# Patient Record
Sex: Female | Born: 1960 | Race: White | Hispanic: Refuse to answer | Marital: Married | Smoking: Never smoker
Health system: Southern US, Community
[De-identification: ages and names within clinical notes are randomized; demographics above are authoritative.]

## PROBLEM LIST (undated history)

## (undated) DIAGNOSIS — I10 Essential (primary) hypertension: Secondary | ICD-10-CM

## (undated) HISTORY — PX: VAGINAL HYSTERECTOMY: SUR661

---

## 2018-01-18 ENCOUNTER — Observation Stay (HOSPITAL_COMMUNITY)
Admission: AD | Admit: 2018-01-18 | Discharge: 2018-01-19 | Disposition: A | Discharge status: Home / Self Care | Payer: BC Managed Care – PPO | Source: Other Acute Inpatient Hospital | Attending: Internal Medicine | Admitting: Internal Medicine

## 2018-01-18 ENCOUNTER — Encounter (HOSPITAL_COMMUNITY): Payer: Self-pay | Admitting: Internal Medicine

## 2018-01-18 DIAGNOSIS — W1809XA Striking against other object with subsequent fall, initial encounter: Secondary | ICD-10-CM | POA: Diagnosis not present

## 2018-01-18 DIAGNOSIS — S82401A Unspecified fracture of shaft of right fibula, initial encounter for closed fracture: Secondary | ICD-10-CM | POA: Diagnosis present

## 2018-01-18 DIAGNOSIS — Z8249 Family history of ischemic heart disease and other diseases of the circulatory system: Secondary | ICD-10-CM | POA: Diagnosis not present

## 2018-01-18 DIAGNOSIS — I700 Atherosclerosis of aorta: Secondary | ICD-10-CM | POA: Insufficient documentation

## 2018-01-18 DIAGNOSIS — I10 Essential (primary) hypertension: Secondary | ICD-10-CM | POA: Diagnosis present

## 2018-01-18 DIAGNOSIS — S82841A Displaced bimalleolar fracture of right lower leg, initial encounter for closed fracture: Secondary | ICD-10-CM | POA: Insufficient documentation

## 2018-01-18 DIAGNOSIS — M19019 Primary osteoarthritis, unspecified shoulder: Secondary | ICD-10-CM | POA: Insufficient documentation

## 2018-01-18 DIAGNOSIS — Z880 Allergy status to penicillin: Secondary | ICD-10-CM | POA: Insufficient documentation

## 2018-01-18 DIAGNOSIS — Z79899 Other long term (current) drug therapy: Secondary | ICD-10-CM | POA: Diagnosis not present

## 2018-01-18 DIAGNOSIS — R9431 Abnormal electrocardiogram [ECG] [EKG]: Secondary | ICD-10-CM

## 2018-01-18 DIAGNOSIS — I214 Non-ST elevation (NSTEMI) myocardial infarction: Secondary | ICD-10-CM | POA: Insufficient documentation

## 2018-01-18 HISTORY — DX: Essential (primary) hypertension: I10

## 2018-01-18 HISTORY — DX: Unspecified fracture of shaft of right fibula, initial encounter for closed fracture: S82.401A

## 2018-01-18 HISTORY — DX: Abnormal electrocardiogram (ECG) (EKG): R94.31

## 2018-01-18 LAB — COMPREHENSIVE METABOLIC PANEL
ALK PHOS: 42 U/L (ref 38–126)
ALT: 18 U/L (ref 0–44)
AST: 14 U/L — AB (ref 15–41)
Albumin: 3.4 g/dL — ABNORMAL LOW (ref 3.5–5.0)
Anion gap: 10 (ref 5–15)
BILIRUBIN TOTAL: 0.9 mg/dL (ref 0.3–1.2)
BUN: 9 mg/dL (ref 6–20)
CALCIUM: 8.8 mg/dL — AB (ref 8.9–10.3)
CO2: 22 mmol/L (ref 22–32)
CREATININE: 0.54 mg/dL (ref 0.44–1.00)
Chloride: 107 mmol/L (ref 98–111)
GFR calc Af Amer: >60 mL/min (ref >60)
GLUCOSE: 104 mg/dL — AB (ref 70–99)
POTASSIUM: 4.2 mmol/L (ref 3.5–5.1)
Sodium: 139 mmol/L (ref 135–145)
TOTAL PROTEIN: 5.9 g/dL — AB (ref 6.5–8.1)

## 2018-01-18 LAB — CBC
HEMATOCRIT: 39.1 % (ref 36.0–46.0)
HEMOGLOBIN: 12.9 g/dL (ref 12.0–15.0)
MCH: 29.8 pg (ref 26.0–34.0)
MCHC: 33 g/dL (ref 30.0–36.0)
MCV: 90.3 fL (ref 78.0–100.0)
Platelets: 297 10*3/uL (ref 150–400)
RBC: 4.33 MIL/uL (ref 3.87–5.11)
RDW: 12.2 % (ref 11.5–15.5)
WBC: 11.6 10*3/uL — AB (ref 4.0–10.5)

## 2018-01-18 LAB — TROPONIN I: Troponin I: <0.03 ng/mL (ref <0.03)

## 2018-01-18 LAB — MRSA PCR SCREENING: MRSA by PCR: NEGATIVE

## 2018-01-18 MED ORDER — SODIUM CHLORIDE 0.9 % IV SOLN: 250.0000 mL | INTRAVENOUS | Status: DC | PRN

## 2018-01-18 MED ORDER — SODIUM CHLORIDE 0.9% FLUSH
3.0000 mL | Freq: Two times a day (BID) | INTRAVENOUS | Status: DC
  Administered 2018-01-18 – 2018-01-19 (×2): 3 mL via INTRAVENOUS

## 2018-01-18 MED ORDER — ACETAMINOPHEN 650 MG RE SUPP: 650.0000 mg | Freq: Four times a day (QID) | RECTAL | Status: DC | PRN

## 2018-01-18 MED ORDER — SODIUM CHLORIDE 0.9% FLUSH: 3.0000 mL | INTRAVENOUS | Status: DC | PRN

## 2018-01-18 MED ORDER — CLONAZEPAM 0.5 MG PO TABS: 0.5000 mg | ORAL_TABLET | Freq: Every evening | ORAL | Status: DC | PRN

## 2018-01-18 MED ORDER — ENOXAPARIN SODIUM 40 MG/0.4ML ~~LOC~~ SOLN
40.0000 mg | SUBCUTANEOUS | Status: DC
  Administered 2018-01-19: 40 mg via SUBCUTANEOUS
  Filled 2018-01-18: qty 0.4

## 2018-01-18 MED ORDER — ONDANSETRON HCL 4 MG/2ML IJ SOLN: 4.0000 mg | Freq: Four times a day (QID) | INTRAMUSCULAR | Status: DC | PRN

## 2018-01-18 MED ORDER — ATORVASTATIN CALCIUM 80 MG PO TABS: 80.0000 mg | ORAL_TABLET | Freq: Every day | ORAL | Status: DC

## 2018-01-18 MED ORDER — ACETAMINOPHEN 325 MG PO TABS: 650.0000 mg | ORAL_TABLET | Freq: Four times a day (QID) | ORAL | Status: DC | PRN

## 2018-01-18 MED ORDER — CARVEDILOL 3.125 MG PO TABS
3.1250 mg | ORAL_TABLET | Freq: Two times a day (BID) | ORAL | Status: DC
  Administered 2018-01-19: 3.125 mg via ORAL
  Filled 2018-01-18: qty 1

## 2018-01-18 MED ORDER — MORPHINE SULFATE (PF) 2 MG/ML IV SOLN: 2.0000 mg | INTRAVENOUS | Status: DC | PRN

## 2018-01-18 MED ORDER — ASPIRIN EC 325 MG PO TBEC
325.0000 mg | DELAYED_RELEASE_TABLET | Freq: Every day | ORAL | Status: DC
  Administered 2018-01-18 – 2018-01-19 (×2): 325 mg via ORAL
  Filled 2018-01-18 (×2): qty 1

## 2018-01-18 MED ORDER — OXYCODONE HCL 5 MG PO TABS
5.0000 mg | ORAL_TABLET | ORAL | Status: DC | PRN
  Administered 2018-01-18 – 2018-01-19 (×5): 5 mg via ORAL
  Filled 2018-01-18 (×5): qty 1

## 2018-01-18 NOTE — H&P (Signed)
TRH H&P   Patient Demographics:    Frances Bell, is a 57 y.o. female  MRN: [id number]   DOB - Apr 24, 1961  Admit Date - 01/18/2018  Outpatient Primary MD for the patient is No primary care provider on file.  Referring MD/NP/PA: Santiago Bumpersavid Borowski  Outpatient Specialists:   Patient coming [facility]: [facility]  No chief complaint on file.  Abnormal EKG   HPI:    Frances Bell  is a 57 y.o. female, w hypertension, and apparently presented to [facility] on 01/15/2018 with right ankle pain.  Apparently she tripped on a toy on 01/12/2018 and had pain and swelling.  Pt was seen in ER  CT scan 01/12/2018=>  Impression 1. Medial and posterior malleolar fractures of the right ankle with distal fibular diaphyseal fracture and also demonstrated in near anatomic alignment post reduction.  Reduction of distlocated ankle joint.  2. Moderate degree of medial soft tissue swelling is noted about the ankle status post trauma.    In ER a closed reduction and splinting performed.  Pt was apparently taking to OR 01/18/2018 for ORIF of R distal fibrular and medial malleolus with closed reduction and internal fixation of syndesmosis disruption.    Post surgery in [place],  ST depression was noted on monitor.  Pt had 12 lead ekg that showed up to 3mm of deep ST depression v3-6 and deep symmetric T wave inversions.  Cardiology Dr. Junious DresserBoroski was consulted and recommended [facility].    I could not find troponin done at [facility]    Review of systems:    In addition to the HPI above, No Fever-chills, No Headache, No changes with Vision or hearing, No problems swallowing food or Liquids, No Chest pain, Cough or Shortness of Breath, No Abdominal pain, No Nausea or Vommitting, Bowel movements are regular, No Blood in stool or Urine, No dysuria, No  new skin rashes or bruises, ,  No new weakness, tingling, numbness in any extremity, No recent weight gain or loss, No polyuria, polydypsia or polyphagia, No significant Mental Stressors.  A full 10 point Review of Systems was done, except as stated above, all other Review of Systems were negative.   With Past History of the following :    Past Medical History:  Diagnosis Date  . Hypertension       Past Surgical History:  Procedure Laterality Date  . VAGINAL HYSTERECTOMY        Social History:     Social History   Tobacco Use  . Smoking status: Never Smoker  . Smokeless tobacco: Never Used  Substance Use Topics  . Alcohol use: Not Currently     Lives - at home Mobility - typically walks by self   Family History :     Family History  Problem Relation Age of Onset  . Lung  cancer Mother   . CAD Mother        ? broken heart at age 41  . Lymphoma Sister   . Prostate cancer Brother       Home Medications:   Prior to Admission medications   Not on File     Allergies:     Allergies  Allergen Reactions  . Amoxicillin Hives     Physical Exam:   Vitals  Prior to transfer  T 98.4 P 71  R 18, pox 100% on RA  Bp 133/78 (1539)  Blood pressure 130/70, pulse 69, temperature 98.3 F (36.8 C), temperature source Oral, resp. rate 15.   1. General  lying in bed in NAD,    2. Normal affect and insight, Not Suicidal or Homicidal, Awake Alert, Oriented X 3.  3. No F.N deficits, ALL C.Nerves Intact, Strength 5/5 all 4 extremities, Sensation intact all 4 extremities, Plantars down going.  4. Ears and Eyes appear Normal, Conjunctivae clear, PERRLA. Moist Oral Mucosa.  5. Supple Neck, No JVD, No cervical lymphadenopathy appriciated, No Carotid Bruits.  6. Symmetrical Chest wall movement, Good air movement bilaterally, CTAB.  7. RRR, No Gallops, Rubs or Murmurs, No Parasternal Heave.  8. Positive Bowel Sounds, Abdomen Soft, No tenderness, No organomegaly  appriciated,No rebound -guarding or rigidity.  9.  No Cyanosis, Normal Skin Turgor, No Skin Rash or Bruise.  10. Good muscle tone,  joints appear normal , no effusions, Normal ROM.  11. No Palpable Lymph Nodes in Neck or Axillae     Data Review:    CBC No results for input(s): WBC, HGB, HCT, PLT, MCV, MCH, MCHC, RDW, LYMPHSABS, MONOABS, EOSABS, BASOSABS, BANDABS in the last 168 hours.  Invalid input(s): NEUTRABS, BANDSABD ------------------------------------------------------------------------------------------------------------------  Chemistries  No results for input(s): NA, K, CL, CO2, GLUCOSE, BUN, CREATININE, CALCIUM, MG, AST, ALT, ALKPHOS, BILITOT in the last 168 hours.  Invalid input(s): GFRCGP ------------------------------------------------------------------------------------------------------------------ CrCl cannot be calculated (No successful lab value found.). ------------------------------------------------------------------------------------------------------------------ No results for input(s): TSH, T4TOTAL, T3FREE, THYROIDAB in the last 72 hours.  Invalid input(s): FREET3  Coagulation profile No results for input(s): INR, PROTIME in the last 168 hours. ------------------------------------------------------------------------------------------------------------------- No results for input(s): DDIMER in the last 72 hours. -------------------------------------------------------------------------------------------------------------------  Cardiac Enzymes No results for input(s): CKMB, TROPONINI, MYOGLOBIN in the last 168 hours.  Invalid input(s): CK ------------------------------------------------------------------------------------------------------------------ No results found for: BNP   ---------------------------------------------------------------------------------------------------------------  Urinalysis No results found for: COLORURINE, APPEARANCEUR,  LABSPEC, PHURINE, GLUCOSEU, HGBUR, BILIRUBINUR, KETONESUR, PROTEINUR, UROBILINOGEN, NITRITE, LEUKOCYTESUR  ----------------------------------------------------------------------------------------------------------------   Imaging Results:    No results found.  ekg reviewed nsr at 90, nl axis, nl pr int, slight prolongation of qtc, st depression v3-6, 2, 3, avf, and t inversion in 1, avl and v2-6   Assessment & Plan:    Principal Problem:   Abnormal EKG Active Problems:   Hypertension   Closed right fibular fracture    Abnormal EKG, concern for NSTEMI per cardiology Trop I q6h x3 Check lipid Check cardiac echo Aspirin Start Lipitor 80mg  po qhs Start Carvedilol 3.125mg  po bid No heparin iv due to recent surgery per Dr. Nino Parsley, defer to cardiology here NPO after [facility] consult requested, personally called cardiology to request input, appreciate guidance   R distal fibular fracture Orthopedics apparently consulted ? email from [facility] states that they are ok with following ? Morphine sulfate 0.5mg  iv q3h prn  Hypertension Start carvedilol as above     DVT Prophylaxis  Lovenox - SCDs  AM  Labs Ordered, also please review Full Orders  Family Communication: Admission, patients condition and plan of care including tests being ordered have been discussed with the patient  who indicate understanding and agree with the plan and Code Status.  Code Status FULL CODE  Likely DC to  home  Condition GUARDED    Consults called: cardiology consulted.    Admission status: observation, pt requires hospitalization for abnormal EKG and concern for NSTEMI  Time spent in minutes :  70 minutes   Pearson Grippe M.D on 01/18/2018 at 7:47 PM  Between 7am to 7pm - Pager - [phone] . After 7pm go to [email or web address] - password [facility]  [facility] - Office  [phone]

## 2018-01-18 NOTE — Consult Note (Addendum)
Cardiology Consultation:   Patient ID: Frances Bell MRN: [id number]; DOB: 12-02-1960  Admit date: 01/18/2018 Date of Consult: 01/18/2018  Primary Care Provider: No primary care provider on file. Primary Cardiologist: New - transfer from [facility]   Patient Profile:   Frances Bell is a 57 y.o. female with a history of hypertension who is being seen today for the evaluation of abnormal ECG at the request of Dr. Selena Batten.  History of Present Illness:   Ms. Frances Bell was transferred this evening from [facility] after undergoing elective right ankle surgery for repair of a fracture that occurred following fall as an outpatient.  Anesthesia was via popliteal fossa block.  She underwent ORIF right distal fibula and medial malleolus with closed reduction and internal fixation.  She was noted to have ST segment depression by telemetry monitoring postoperatively resulting in an ECG which showed sinus rhythm with increased voltage particularly in the lateral leads as well as anterolateral and inferior ST segment depression with T wave inversions in the anterolateral leads.  ST elevation noted in aVR.  Unfortunately, there was no old ECG for comparison.  She was seen in consultation by Dr. Nino Parsley who was concerned about the possibility of NSTEMI with ischemic ECG changes.  She did have troponin I levels checked that were negative.  Case was discussed with Dr. Delton See who agreed with transfer to [facility] for further evaluation, in case cardiac catheterization was needed.  In speaking with [name] this evening, she does not report any regular, reproducible chest pain with exertion, no limiting shortness of breath with activity, no palpitations or syncope.  She has no known history of cardiovascular disease.  She does have hypertension and is on Norvasc as an outpatient.   She states that her mother experienced a heart attack due to "broken heart syndrome" in her early 43s.  Per multiple family  members in the room, patient did have an echocardiogram done at [facility], however there is no report provided with her records.  They were under the impression that this study was normal.  Past Medical History:  Diagnosis Date  . Essential hypertension     Past Surgical History:  Procedure Laterality Date  . VAGINAL HYSTERECTOMY      Inpatient Medications: Scheduled Meds: . aspirin EC  325 mg Oral Daily  . [START ON 01/19/2018] atorvastatin  80 mg Oral q1800  . [START ON 01/19/2018] carvedilol  3.125 mg Oral BID WC  . [START ON 01/19/2018] enoxaparin (LOVENOX) injection  40 mg Subcutaneous Q24H  . sodium chloride flush  3 mL Intravenous Q12H   Continuous Infusions: . sodium chloride     PRN Meds: sodium chloride, acetaminophen **OR** acetaminophen, clonazePAM, morphine injection, ondansetron (ZOFRAN) IV, oxyCODONE, sodium chloride flush  Allergies:    Allergies  Allergen Reactions  . Amoxicillin Hives    Social History:   Social History   Socioeconomic History  . Marital status: Unknown    Spouse name: Not on file  . Number of children: Not on file  . Years of education: Not on file  . Highest education level: Not on file  Occupational History  . Not on file  Social Needs  . Financial resource strain: Not on file  . Food insecurity:    Worry: Not on file    Inability: Not on file  . Transportation needs:    Medical: Not on file    Non-medical: Not on file  Tobacco Use  . Smoking status: Never Smoker  .  Smokeless tobacco: Never Used  Substance and Sexual Activity  . Alcohol use: Not Currently  . Drug use: Not on file  . Sexual activity: Not on file  Lifestyle  . Physical activity:    Days per week: Not on file    Minutes per session: Not on file  . Stress: Not on file  Relationships  . Social connections:    Talks on phone: Not on file    Gets together: Not on file    Attends religious service: Not on file    Active member of club or  organization: Not on file    Attends meetings of clubs or organizations: Not on file    Relationship status: Not on file  . Intimate partner violence:    Fear of current or ex partner: Not on file    Emotionally abused: Not on file    Physically abused: Not on file    Forced sexual activity: Not on file  Other Topics Concern  . Not on file  Social History Narrative  . Not on file    Family History:   Family History  Problem Relation Age of Onset  . Lung cancer Mother   . CAD Mother        ? broken heart at age 68  . Lymphoma Sister   . Prostate cancer Brother      ROS:  Please see the history of present illness.  Mild arthritic shoulder symptoms.  All other ROS reviewed and negative.     Physical Exam/Data:   Vitals:   01/18/18 1700 01/18/18 1800 01/18/18 2005  BP:  130/70 (P) 140/81  Pulse:  69 (P) 80  Resp:  15 (P) 18  Temp: 98.3 F (36.8 C)  (P) 98.5 F (36.9 C)  TempSrc: Oral  (P) Oral  SpO2:   (P) 99%   No intake or output data in the 24 hours ending 01/18/18 2007 There were no vitals filed for this visit. There is no height or weight on file to calculate BMI.   General:  Well nourished, well developed, in no acute distress HEENT: normal Lymph: no adenopathy Neck: no JVD Endocrine:  No thryomegaly Vascular: No carotid bruits  Cardiac:  normal S1, S2; RRR; soft systolic murmur, no gallop Lungs:  clear to auscultation bilaterally, no wheezing, rhonchi or rales  Abd: soft, nontender, no hepatomegaly  Ext: no edema, right ankle dressed postoperatively Musculoskeletal:  No deformities, BUE and BLE strength normal and equal Skin: warm and dry  Neuro:  CNs 2-12 intact, no focal abnormalities noted Psych:  Normal affect   EKG:  The EKG was personally reviewed and demonstrates: Sinus rhythm with LVH and predominantly anterolateral ST depression with T wave inversions.  It appears that the limb leads are reversed. Telemetry:  Telemetry was personally reviewed  and demonstrates: Sinus rhythm.  Relevant CV Studies:  No old tracings available.  Echocardiogram report from [facility] not available.  Laboratory Data:  Hematology Recent Labs  Lab 01/18/18 1900  WBC 11.6*  RBC 4.33  HGB 12.9  HCT 39.1  MCV 90.3  MCH 29.8  MCHC 33.0  RDW 12.2  PLT 297    Radiology/Studies:  No results found.  Assessment and Plan:   1.  Abnormal ECG noted in the postoperative setting, however without baseline ECG for comparison.  Chronicity of the increased voltage and ST-T wave abnormalities is uncertain.  Although diffuse ischemic change is certainly a possibility, these changes could also be seen with  LVH and repolarization abnormalities, particularly in the absence of chest pain or hemodynamic instability.  She does not report any active angina at this time.  Follow-up ECG shows similar abnormalities particularly in the anterolateral leads, although the limb leads look to be reversed with otherwise nonspecific ST changes.  Records indicate [facility] trop[facility] I levels at [facility].  She reportedly had an echocardiogram done at that facility, although the report is not available at this time.  2.  Essential hypertension by history, on Norvasc as an outpatient.  3.  History of heart disease in the patient's mother, reportedly "broken heart syndrome" in her early 5950s.  Uncertain if she has history of ischemic heart disease or cardiomyopathy.  I reviewed the available information and spoke with the patient and multiple family members present.  She is being admitted to the internal medicine service for further observation.  Would recommend cycling a full set of cardiac markers and repeat ECG in the morning.  If the actual echocardiographic images from [facility] can be reviewed this may be sufficient, however if this is not the case would proceed with a repeat echocardiogram tomorrow to clarify cardiac structure and function, specifically degree of LVH  and evaluation of any potential focal wall motion abnormalities.  Ms. Richardson DoppCole does not require urgent cardiac catheterization at this time based on present condition and current information.  She will however be followed by our cardiology service with further recommendations regarding mode and timing of ischemic evaluation.  My general impression in speaking with the patient and family members this evening, is that they may be most reassured by a diagnostic cardiac catheterization even if her cardiac enzymes are normal and ECG remains stable in the absence of symptoms.   For questions or updates, please contact [facility] Please consult [email or web address] for contact info under   Signed, Nona DellSamuel Anmarie Fukushima, MD  01/18/2018 8:07 PM

## 2018-01-18 NOTE — Progress Notes (Signed)
Patient ID: Frances Bell, female   DOB: March 03, 1961, 57 y.o.   MRN: [id number]  Pt not yet arrived as of the time of this note. Will plan on seeing pt in AM. For tonight she should be NWB on her operative extremity with elevation whenever possible.    Freeman CaldronMichael J. Sharlon Pfohl, PA-C Orthopedic Surgery [phone]

## 2018-01-18 NOTE — Progress Notes (Addendum)
[facility]. Oriented to unit and room. Safety discussed and call bell with in reach. VSS. Paging MD for orders  Elijah BirkHolly Niaja Stickley, RN   MD arrived at bedside @1829 

## 2018-01-19 ENCOUNTER — Other Ambulatory Visit: Payer: Self-pay

## 2018-01-19 ENCOUNTER — Other Ambulatory Visit (HOSPITAL_COMMUNITY): Payer: Self-pay

## 2018-01-19 DIAGNOSIS — I10 Essential (primary) hypertension: Secondary | ICD-10-CM | POA: Diagnosis not present

## 2018-01-19 DIAGNOSIS — I700 Atherosclerosis of aorta: Secondary | ICD-10-CM | POA: Diagnosis not present

## 2018-01-19 DIAGNOSIS — S82841A Displaced bimalleolar fracture of right lower leg, initial encounter for closed fracture: Secondary | ICD-10-CM | POA: Diagnosis not present

## 2018-01-19 DIAGNOSIS — R9431 Abnormal electrocardiogram [ECG] [EKG]: Secondary | ICD-10-CM | POA: Diagnosis not present

## 2018-01-19 LAB — LIPID PANEL
CHOL/HDL RATIO: 4 ratio
Cholesterol: 140 mg/dL (ref 0–200)
HDL: 35 mg/dL — AB (ref >40)
LDL CALC: 92 mg/dL (ref 0–99)
Triglycerides: 67 mg/dL (ref <150)
VLDL: 13 mg/dL (ref 0–40)

## 2018-01-19 LAB — CBC
HEMATOCRIT: 36.4 % (ref 36.0–46.0)
HEMOGLOBIN: 12.2 g/dL (ref 12.0–15.0)
MCH: 30.1 pg (ref 26.0–34.0)
MCHC: 33.5 g/dL (ref 30.0–36.0)
MCV: 89.9 fL (ref 78.0–100.0)
Platelets: 280 10*3/uL (ref 150–400)
RBC: 4.05 MIL/uL (ref 3.87–5.11)
RDW: 12.2 % (ref 11.5–15.5)
WBC: 11.3 10*3/uL — AB (ref 4.0–10.5)

## 2018-01-19 LAB — COMPREHENSIVE METABOLIC PANEL
ALBUMIN: 3 g/dL — AB (ref 3.5–5.0)
ALT: 16 U/L (ref 0–44)
ANION GAP: 6 (ref 5–15)
AST: 14 U/L — ABNORMAL LOW (ref 15–41)
Alkaline Phosphatase: 38 U/L (ref 38–126)
BILIRUBIN TOTAL: 0.8 mg/dL (ref 0.3–1.2)
BUN: 11 mg/dL (ref 6–20)
CHLORIDE: 108 mmol/L (ref 98–111)
CO2: 25 mmol/L (ref 22–32)
Calcium: 8.6 mg/dL — ABNORMAL LOW (ref 8.9–10.3)
Creatinine, Ser: 0.57 mg/dL (ref 0.44–1.00)
GFR calc Af Amer: >60 mL/min (ref >60)
GFR calc non Af Amer: >60 mL/min (ref >60)
GLUCOSE: 91 mg/dL (ref 70–99)
POTASSIUM: 3.7 mmol/L (ref 3.5–5.1)
Sodium: 139 mmol/L (ref 135–145)
TOTAL PROTEIN: 5.5 g/dL — AB (ref 6.5–8.1)

## 2018-01-19 LAB — TROPONIN I

## 2018-01-19 LAB — HIV ANTIBODY (ROUTINE TESTING W REFLEX): HIV Screen 4th Generation wRfx: NONREACTIVE

## 2018-01-19 NOTE — Progress Notes (Signed)
Removed PIV access and pt received discharge instructions. She understood it well. Pt's family took her all belongings. HS [employer]Lee RN

## 2018-01-19 NOTE — Progress Notes (Signed)
Progress Note  Patient Name: Frances Bell Date of Encounter: 01/19/2018  Primary Cardiologist: New--wants to be seen in [place]  Subjective   Patient seen this AM, history clarified. Has never had a history of heart disease or any heart issues. Never had chest pain. Never had a reason to have an ECG before. Incidentally found to have ST depressions perioperatively. Sent to [facility] for further evaluation.  Initially echo results not in [place] chart. Her husband had the results faxed here and went himself to bring a copy of the echo on CD from [place].  Inpatient Medications    Scheduled Meds: . aspirin EC  325 mg Oral Daily  . atorvastatin  80 mg Oral q1800  . carvedilol  3.125 mg Oral BID WC  . enoxaparin (LOVENOX) injection  40 mg Subcutaneous Q24H  . sodium chloride flush  3 mL Intravenous Q12H   Continuous Infusions: . sodium chloride     PRN Meds: sodium chloride, acetaminophen **OR** acetaminophen, clonazePAM, morphine injection, ondansetron (ZOFRAN) IV, oxyCODONE, sodium chloride flush   Vital Signs    Vitals:   01/18/18 2319 01/19/18 0358 01/19/18 0600 01/19/18 0734  BP: 114/69 123/76  139/76  Pulse: 67 67  67  Resp: 16 12  (!) 22  Temp: 98.5 F (36.9 C) 98.7 F (37.1 C)  98.7 F (37.1 C)  TempSrc: Oral Oral  Oral  SpO2: 96% 98%  97%  Weight:  62.6 kg    Height:   5\' 4"  (1.626 m)     Intake/Output Summary (Last 24 hours) at 01/19/2018 1032 Last data filed at 01/19/2018 0926 Gross per 24 hour  Intake 3 ml  Output -  Net 3 ml   Filed Weights   01/19/18 0358  Weight: 62.6 kg    Telemetry    NSR - Personally Reviewed  ECG    SR, short PR, LVH with repol abnormality - Personally Reviewed  Physical Exam   GEN: No acute distress.   Neck: supple, no JVD Cardiac: regular S1 and S2, no murmurs, rubs, or gallops.  Respiratory: Clear to auscultation bilaterally. GI: Soft, nontender, non-distended. Bowel sounds normal MS: No edema; No deformity. Right  foot casted/bandaged Neuro:  Nonfocal, moves all limbs independently Psych: Normal affect   Labs    Chemistry Recent Labs  Lab 01/18/18 1900 01/19/18 0607  NA 139 139  K 4.2 3.7  CL 107 108  CO2 22 25  GLUCOSE 104* 91  BUN 9 11  CREATININE 0.54 0.57  CALCIUM 8.8* 8.6*  PROT 5.9* 5.5*  ALBUMIN 3.4* 3.0*  AST 14* 14*  ALT 18 16  ALKPHOS 42 38  BILITOT 0.9 0.8  GFRNONAA >60 >60  GFRAA >60 >60  ANIONGAP 10 6     Hematology Recent Labs  Lab 01/18/18 1900 01/19/18 0607  WBC 11.6* 11.3*  RBC 4.33 4.05  HGB 12.9 12.2  HCT 39.1 36.4  MCV 90.3 89.9  MCH 29.8 30.1  MCHC 33.0 33.5  RDW 12.2 12.2  PLT 297 280    Cardiac Enzymes Recent Labs  Lab 01/18/18 1900 01/19/18 0041 01/19/18 0607  TROPONINI <0.03 <0.03 <0.03   No results for input(s): TROPIPOC in the last 168 hours.   BNPNo results for input(s): BNP, PROBNP in the last 168 hours.   DDimer No results for input(s): DDIMER in the last 168 hours.   Radiology    No results found.  Cardiac Studies   Echo from [place] reviewed, both report and imaging. Report states  Normal LV size and thickness, normal LVEF 60-65%. Normal atria, mild aortic sclerosis without stenosis. No significant valvular disease. On my review. LV does appear a little thick, the measurements of 1.1cm thickness might be slight underestimation, but it does not appear severely thickened.  Patient Profile     57 y.o. female s/p elective r ankle surgery, found to have perioperative ST depressions on ECG. No chest pain, negative troponins, normal echo (possible mild LVH).  Assessment & Plan    ST depressions on ECG: while her LVH is not severe, her ECG is consistent with increased LV mass with repolarization abnormalities. Without chest pain and with a normal echo, she does not require additional inpatient workup.  Blood pressure well controlled currently. Recommend that she follow up with one of my [place] in [place].    Time Spent  Directly with Patient: I have spent a total of 50 minutes with the patient reviewing hospital notes, telemetry, EKGs, labs and examining the patient as well as establishing an assessment and plan that was discussed personally with the patient.  > 50% of time was spent in direct patient care.  Length of Stay:  LOS: 1 day   Jodelle RedBridgette Barnaby Rippeon, MD, PhD [facility]  [facility]   01/19/2018, 10:32 AM  [facility] will sign off.   Medication Recommendations:  Continue amlodipine Other recommendations (labs, testing, etc):  none Follow up as an outpatient:  Follow up with either Dr. Consuello Masseevankar, [place], or Bing MatterKrasowski in [place]  For questions or updates, please contact [facility] Please consult [email or web address] for contact info under Cardiology/STEMI.

## 2018-01-19 NOTE — Discharge Summary (Signed)
DISCHARGE SUMMARY  Frances Bell  MR#: [id number]  DOB:12-01-1960  Date of Admission: 01/18/2018 Date of Discharge: 01/19/2018  Attending Physician:Frances Bell  Patient's PCP:Frances Bell  Consults:  Frances Bell   Disposition: D/C home   Follow-up Appts: Follow-up Information    Frances Bell. Schedule an appointment as soon as possible for a visit.   Specialty:  Internal Medicine Why:  Follow up with your Primary Care Physician as needed.   Contact information: [address] [place] [id number] [phone]        Frances Bell Follow up in 2 week(s).   Specialty:  Bell Contact information: [address] [place] [phone]         Discharge Diagnoses: Abnormal postoperative EKG Complex R ankle fracture HTN  Initial presentation: [age] y.o.F w/ a hx of HTN who tripped on a toy on 01/12/2018. Pt was seen in ER on the date of her fall where a CT scan noted medial and posterior malleolar fractures of the right ankle with distal fibular diaphyseal fracture and also demonstrated near anatomic alignment post reduction. A closed reduction and splinting was performed at the time, and she was then taken to the OR 01/18/2018 for ORIF.   Post surgery in the PACU ST depression was noted on the monitor. A 12 lead EKG showed up to 3mm of deep ST depression v3-6 and deep symmetric T wave inversions. Bell (Frances Bell) was consulted, and recommended transfer to Frances Bell.   Bell Course: The patient was transferred to Frances Bell for evaluation of EKG changes appreciated after her orthopedic surgery at Frances Bell.  She was monitored on telemetry without any acute findings.  Troponins were cycled and were completely normal x3.  She had no chest pain or shortness of breath at any point during the evaluation.  Bell was consulted and felt that her EKG changes were most  consistent with left ventricular hypertrophy.  There was evidence of increased left ventricular mass on her echocardiogram (carried out at Frances Bell).  No further inpatient evaluation was felt to be indicated.  The patient was cleared for discharge home by the medical service and the Bell service.  She is instructed to follow-up for further evaluation of her LVH with the Frances Bell in [place].  She was otherwise medically stable with no complaints at the time for discharge.  Allergies as of 01/19/2018      Reactions   Amoxicillin Hives      Medication List    STOP taking these medications   ondansetron 4 MG disintegrating tablet Commonly known as:  ZOFRAN-ODT     TAKE these medications   acetaminophen 500 MG tablet Commonly known as:  TYLENOL Take 500 mg by mouth every 6 (six) hours as needed for mild pain.   amLODipine 5 MG tablet Commonly known as:  NORVASC Take 5 mg by mouth daily.   ibuprofen 200 MG tablet Commonly known as:  ADVIL,MOTRIN Take 800 mg by mouth every 6 (six) hours as needed for moderate pain.   meloxicam 15 MG tablet Commonly known as:  MOBIC Take 15 mg by mouth daily as needed for pain.   oxyCODONE 5 MG immediate release tablet Commonly known as:  Oxy IR/ROXICODONE Take 5 mg by mouth every 6 (six) hours as needed for moderate pain.   temazepam 15 MG capsule Commonly known as:  RESTORIL Take 15 mg by mouth at  bedtime as needed.       Day of Discharge BP 125/76 (BP Location: Right Arm)   Pulse 64   Temp 98.4 F (36.9 C) (Oral)   Resp 14   Ht 5\' 4"  (1.626 m)   Wt 62.6 kg   SpO2 95%   BMI 23.67 kg/m   Physical Exam: General: No acute respiratory distress Lungs: Clear to auscultation bilaterally without wheezes or crackles Cardiovascular: Regular rate and rhythm without murmur gallop or rub normal S1 and S2 Extremities: No significant cyanosis, clubbing, or edema bilateral lower extremities  Basic Metabolic  Panel: Recent Labs  Lab 01/18/18 1900 01/19/18 0607  NA 139 139  K 4.2 3.7  CL 107 108  CO2 22 25  GLUCOSE 104* 91  BUN 9 11  CREATININE 0.54 0.57  CALCIUM 8.8* 8.6*    Liver Function Tests: Recent Labs  Lab 01/18/18 1900 01/19/18 0607  AST 14* 14*  ALT 18 16  ALKPHOS 42 38  BILITOT 0.9 0.8  PROT 5.9* 5.5*  ALBUMIN 3.4* 3.0*    CBC: Recent Labs  Lab 01/18/18 1900 01/19/18 0607  WBC 11.6* 11.3*  HGB 12.9 12.2  HCT 39.1 36.4  MCV 90.3 89.9  PLT 297 280    Cardiac Enzymes: Recent Labs  Lab 01/18/18 1900 01/19/18 0041 01/19/18 0607  TROPONINI <0.03 <0.03 <0.03    Recent Results (from the past 240 hour(s))  MRSA PCR Screening     Status: None   Collection Time: 01/18/18  7:37 PM  Result Value Ref Range Status   MRSA by PCR NEGATIVE NEGATIVE Final    Comment:        The GeneXpert MRSA Assay (FDA approved for NASAL specimens only), is one component of a comprehensive MRSA colonization surveillance program. It is not intended to diagnose MRSA infection nor to guide or monitor treatment for MRSA infections. Performed at [facility], [address] [place], [place] [address]      Time spent in discharge (includes decision making & examination of pt): <30 minutes  01/19/2018, 2:34 PM   Lonia Blood, [facility]  [phone] Pager [phone]  On-Call/Text Bell:      [email or web address]      password [facility]

## 2018-03-09 ENCOUNTER — Encounter: Payer: Self-pay | Admitting: Cardiology

## 2018-03-09 ENCOUNTER — Ambulatory Visit (INDEPENDENT_AMBULATORY_CARE_PROVIDER_SITE_OTHER): Payer: BC Managed Care – PPO | Admitting: Cardiology

## 2018-03-09 VITALS — BP 130/82 | HR 78 | Ht 64.0 in | Wt 141.0 lb

## 2018-03-09 DIAGNOSIS — R9431 Abnormal electrocardiogram [ECG] [EKG]: Secondary | ICD-10-CM | POA: Diagnosis not present

## 2018-03-09 DIAGNOSIS — I10 Essential (primary) hypertension: Secondary | ICD-10-CM | POA: Diagnosis not present

## 2018-03-09 NOTE — Progress Notes (Signed)
Cardiology Consultation:    Date:  03/09/2018   ID:  Frances Bell, DOB December 05, 1960, MRN [id number]  PCP:  Simone Curia, MD  Cardiologist:  Gypsy Balsam, MD   Referring MD: Simone Curia, MD   Chief Complaint  Patient presents with  . Hospitalization Follow-up  I have abnormal EKG  History of Present Illness:    Frances Bell is a 57 y.o. female who is being seen today for the evaluation of abnormal EKG at the request of Simone Curia, MD.  Few weeks ago she broke her right ankle, that required surgical intervention after surgery y they noticed that she does have some changes on EKG for 12 days EKG was done showed quite dramatic T inversions in multiple leads biochemical markers were checked all were negative she was transferred to [facility] for evaluation echocardiogram was done which showed only borderline left ventricle hypertrophy and she was told everything is fine and follow-up with [place].  Before she broke her ankle she was able to walk climb stairs with no difficulties as noted, never had chest pain tightness squeezing pressure burning chest. She does have essential hypertension but this is recent discovery she started taking amlodipine not even a year ago. She does not smoke Does have multiple family members with aneurysm in the brain. Never had any heart trouble. Past Medical History:  Diagnosis Date  . Essential hypertension     Past Surgical History:  Procedure Laterality Date  . VAGINAL HYSTERECTOMY      Current Medications: Current Meds  Medication Sig  . alendronate (FOSAMAX) 70 MG tablet Take 1 tablet by mouth once a week.  Marland Kitchen amLODipine (NORVASC) 5 MG tablet Take 5 mg by mouth daily.  Marland Kitchen rOPINIRole (REQUIP) 0.5 MG tablet Take 1 mg by mouth at bedtime.     Allergies:   Amoxicillin   Social History   Socioeconomic History  . Marital status: Unknown    Spouse name: Not on file  . Number of children: Not on file  . Years of education: Not on file  . Highest education  level: Not on file  Occupational History  . Not on file  Social Needs  . Financial resource strain: Not on file  . Food insecurity:    Worry: Not on file    Inability: Not on file  . Transportation needs:    Medical: Not on file    Non-medical: Not on file  Tobacco Use  . Smoking status: Never Smoker  . Smokeless tobacco: Never Used  Substance and Sexual Activity  . Alcohol use: Not Currently  . Drug use: Never  . Sexual activity: Not on file  Lifestyle  . Physical activity:    Days per week: Not on file    Minutes per session: Not on file  . Stress: Not on file  Relationships  . Social connections:    Talks on phone: Not on file    Gets together: Not on file    Attends religious service: Not on file    Active member of club or organization: Not on file    Attends meetings of clubs or organizations: Not on file    Relationship status: Not on file  Other Topics Concern  . Not on file  Social History Narrative  . Not on file     Family History: The patient's family history includes CAD in her mother; Lung cancer in her mother; Lymphoma in her sister; Prostate cancer in her brother. ROS:   Please  see the history of present illness.    All 14 point review of systems negative except as described per history of present illness.  EKGs/Labs/Other Studies Reviewed:    The following studies were reviewed today: Echocardiogram from hospital reviewed showed normal left ventricular ejection fraction left ventricle wall thickness was assessed as normal ejection fraction was 6065%  EKG:  EKG is  ordered today.  The ekg ordered today demonstrates normal sinus rhythm normal P interval left ventricle hypertrophy with T inversion in multiple leads  Recent Labs: 01/19/2018: ALT 16; BUN 11; Creatinine, Ser 0.57; Hemoglobin 12.2; Platelets 280; Potassium 3.7; Sodium 139  Recent Lipid Panel    Component Value Date/Time   CHOL 140 01/19/2018 0041   TRIG 67 01/19/2018 0041   HDL 35 (L)  01/19/2018 0041   CHOLHDL 4.0 01/19/2018 0041   VLDL 13 01/19/2018 0041   LDLCALC 92 01/19/2018 0041    Physical Exam:    VS:  BP 130/82   Pulse 78   Ht 5\' 4"  (1.626 m)   Wt 141 lb (64 kg)   SpO2 98%   BMI 24.20 kg/m     Wt Readings from Last 3 Encounters:  03/09/18 141 lb (64 kg)  01/19/18 137 lb 14.4 oz (62.6 kg)     GEN:  Well nourished, well developed in no acute distress HEENT: Normal NECK: No JVD; No carotid bruits LYMPHATICS: No lymphadenopathy CARDIAC: RRR, no murmurs, no rubs, no gallops RESPIRATORY:  Clear to auscultation without rales, wheezing or rhonchi  ABDOMEN: Soft, non-tender, non-distended MUSCULOSKELETAL:  No edema; No deformity  SKIN: Warm and dry NEUROLOGIC:  Alert and oriented x 3 PSYCHIATRIC:  Normal affect   ASSESSMENT:    1. Essential hypertension   2. Abnormal EKG    PLAN:    In order of problems listed above:  1. Abnormal EKG with quite dramatic T inversions in multiple leads.  Most likely it is related to left ventricle hypertrophy however lady was recently recognized to have hypertension on top of that her echocardiogram burly showed borderline left ventricle hypertrophy.  I think it would be reasonable to perform MRI of the heart in this lady to make sur there is no hypertrophic cardiomyopathy.  I simply have difficult time to explaining such a dramatic EKG changes on this lady of having high blood pressure for short.  Of time without significant left ventricle hypertrophy.  In the future we may also investigate potentially having coronary artery disease however she had absolutely no symptoms of it, therefore it will not be first line of investigation. 2. Essential hypertension appears to be well controlled today. 3. Dyslipidemia her LDL is 92 only without medication we will continue monitoring.   Medication Adjustments/Labs and Tests Ordered: Current medicines are reviewed at length with the patient today.  Concerns regarding medicines  are outlined above.  No orders of the defined types were placed in this encounter.  No orders of the defined types were placed in this encounter.   Signed, Georgeanna Lea, MD, [facility]. 03/09/2018 3:36 PM    [facility]

## 2018-03-09 NOTE — Patient Instructions (Signed)
Medication Instructions:  Your physician recommends that you continue on your current medications as directed. Please refer to the Current Medication list given to you today.  If you need a refill on your cardiac medications before your next appointment, please call your pharmacy.   Lab work: None  If you have labs (blood work) drawn today and your tests are completely normal, you will receive your results only by: Marland Kitchen MyChart Message (if you have MyChart) OR . A paper copy in the mail If you have any lab test that is abnormal or we need to change your treatment, we will call you to review the results.  Testing/Procedures: Your physician has requested that you have a cardiac MRI. Cardiac MRI uses a computer to create images of your heart as its beating, producing both still and moving pictures of your heart and major blood vessels. For further information please visit [email or web address]. Please follow the instruction sheet given to you today for more information.  Follow-Up: At [facility], you and your health needs are our priority.  As part of our continuing mission to provide you with exceptional heart care, we have created designated Provider Care Teams.  These Care Teams include your primary Cardiologist (physician) and Advanced Practice Providers (APPs -  Physician Assistants and Nurse Practitioners) who all work together to provide you with the care you need, when you need it.  You will need a follow up appointment in 4 weeks.  Please call our office 2 months in advance to schedule this appointment.  You may see another member of our [employer] Provider Team in [place]: Norman Herrlich, MD . Belva Crome, MD  Any Other Special Instructions Will Be Listed Below (If Applicable).

## 2018-03-21 ENCOUNTER — Telehealth: Payer: Self-pay | Admitting: Cardiology

## 2018-03-21 NOTE — Telephone Encounter (Signed)
States her MRI still has not been scheduled

## 2018-03-21 NOTE — Telephone Encounter (Signed)
Informed patient that I have reached out to the scheduler for cardiac mri's and will let her know when I have a update on the status of this being scheduled.

## 2018-03-26 NOTE — Telephone Encounter (Signed)
Patient informed that her mri is still pending insurance and she will be contacted once it goes through. Patient verbally understands.

## 2018-04-09 ENCOUNTER — Encounter: Payer: Self-pay | Admitting: *Deleted

## 2018-04-18 ENCOUNTER — Ambulatory Visit: Payer: BC Managed Care – PPO | Admitting: Cardiology

## 2018-04-19 ENCOUNTER — Ambulatory Visit (HOSPITAL_COMMUNITY)
Admission: RE | Admit: 2018-04-19 | Discharge: 2018-04-19 | Disposition: A | Discharge status: Home / Self Care | Payer: BC Managed Care – PPO | Source: Ambulatory Visit | Attending: Cardiology | Admitting: Cardiology

## 2018-04-19 DIAGNOSIS — I081 Rheumatic disorders of both mitral and tricuspid valves: Secondary | ICD-10-CM | POA: Diagnosis not present

## 2018-04-19 DIAGNOSIS — R9431 Abnormal electrocardiogram [ECG] [EKG]: Secondary | ICD-10-CM | POA: Insufficient documentation

## 2018-04-19 LAB — CREATININE, SERUM
CREATININE: 0.75 mg/dL (ref 0.44–1.00)
GFR calc Af Amer: >60 mL/min (ref >60)
GFR calc non Af Amer: >60 mL/min (ref >60)

## 2018-04-19 MED ORDER — GADOBUTROL 1 MMOL/ML IV SOLN
6.5000 mL | Freq: Once | INTRAVENOUS | Status: AC | PRN
  Administered 2018-04-19: 6.5 mL via INTRAVENOUS

## 2018-04-30 ENCOUNTER — Ambulatory Visit: Payer: BC Managed Care – PPO | Admitting: Cardiology

## 2018-04-30 VITALS — BP 134/70 | HR 82 | Ht 64.0 in | Wt 147.4 lb

## 2018-04-30 DIAGNOSIS — R9431 Abnormal electrocardiogram [ECG] [EKG]: Secondary | ICD-10-CM | POA: Diagnosis not present

## 2018-04-30 DIAGNOSIS — I10 Essential (primary) hypertension: Secondary | ICD-10-CM

## 2018-04-30 DIAGNOSIS — I422 Other hypertrophic cardiomyopathy: Secondary | ICD-10-CM

## 2018-04-30 HISTORY — DX: Other hypertrophic cardiomyopathy: I42.2

## 2018-04-30 NOTE — Progress Notes (Signed)
Cardiology Office Note:    Date:  04/30/2018   ID:  Frances Bell, DOB Jul 03, 1960, MRN [id number]  PCP:  Frances Curia, MD  Cardiologist:  Frances Balsam, MD    Referring MD: Frances Curia, MD   Chief Complaint  Patient presents with  . Follow-up    doing well since last OV / FU on heat MRI  Doing very well  History of Present Illness:    Frances Bell is a 57 y.o. female who got quite interesting story.  Apparently she fell down and she broke her ankle she did not passed out she simply tripped on some toy and fell down went to hospital got EKG done which showed some significant changes include symmetrical to inversion in multiple leads she went to surgery with no difficulty and after that she was sent to me for investigation.  Echocardiogram was done which showed mild increased thickening of the wall she does have hypertension.  MRI was done which was inconclusive however recommendation was to follow-up with geneticist with consideration of doing some genetic testing.  She denies have any cardiac symptoms no chest pain tightness squeezing pressure been chest no dizziness or passing out.  She does not have family history of premature cardiac death.  Past Medical History:  Diagnosis Date  . Essential hypertension     Past Surgical History:  Procedure Laterality Date  . VAGINAL HYSTERECTOMY      Current Medications: Current Meds  Medication Sig  . alendronate (FOSAMAX) 70 MG tablet Take 1 tablet by mouth once a week.  Marland Kitchen amLODipine (NORVASC) 10 MG tablet Take 10 mg by mouth daily.   Marland Kitchen rOPINIRole (REQUIP) 0.5 MG tablet Take 1 mg by mouth at bedtime.     Allergies:   Amoxicillin   Social History   Socioeconomic History  . Marital status: Unknown    Spouse name: Not on file  . Number of children: Not on file  . Years of education: Not on file  . Highest education level: Not on file  Occupational History  . Not on file  Social Needs  . Financial resource strain: Not on file  . Food  insecurity:    Worry: Not on file    Inability: Not on file  . Transportation needs:    Medical: Not on file    Non-medical: Not on file  Tobacco Use  . Smoking status: Never Smoker  . Smokeless tobacco: Never Used  Substance and Sexual Activity  . Alcohol use: Not Currently  . Drug use: Never  . Sexual activity: Not on file  Lifestyle  . Physical activity:    Days per week: Not on file    Minutes per session: Not on file  . Stress: Not on file  Relationships  . Social connections:    Talks on phone: Not on file    Gets together: Not on file    Attends religious service: Not on file    Active member of club or organization: Not on file    Attends meetings of clubs or organizations: Not on file    Relationship status: Not on file  Other Topics Concern  . Not on file  Social History Narrative  . Not on file     Family History: The patient's family history includes CAD in her mother; Lung cancer in her mother; Lymphoma in her sister; Prostate cancer in her brother. ROS:   Please see the history of present illness.    All 14 point review  of systems negative except as described per history of present illness  EKGs/Labs/Other Studies Reviewed:     MRI 04/19/2018 1. Normal left ventricular size with mild concentric left ventricular hypertrophy and normal systolic function (LVEF = 65%). There are no regional wall motion abnormalities. There is a spade-shape of the left ventricular apex in systole without significant myocardial hypertrophy. There is no late gadolinium enhancement in the left ventricular myocardium.  2. Normal right ventricular size, thickness and systolic function (LVEF = 67%). There are no regional wall motion abnormalities.  3.  Mildly dilated left atrium (42 mm). Normal right atrial size.  4. Normal size of the aortic root, ascending aorta and pulmonary artery.  5.  Mild mitral and tricuspid regurgitation.  6.  Normal pericardium.  No  pericardial effusion.  These findings are suspicious for an apical form of hypertrophic cardiomyopathy but not consistent as there is no significant myocardial hypertrophy, no apical blood trapping or late gadolinium enhancement. Further evaluation with a geneticist (Dr Frances AceSumy Joseph, PhD) is recommended.  Recent Labs: 01/19/2018: ALT 16; BUN 11; Hemoglobin 12.2; Platelets 280; Potassium 3.7; Sodium 139 04/19/2018: Creatinine, Ser 0.75  Recent Lipid Panel    Component Value Date/Time   CHOL 140 01/19/2018 0041   TRIG 67 01/19/2018 0041   HDL 35 (L) 01/19/2018 0041   CHOLHDL 4.0 01/19/2018 0041   VLDL 13 01/19/2018 0041   LDLCALC 92 01/19/2018 0041    Physical Exam:    VS:  BP 134/70   Pulse 82   Ht 5\' 4"  (1.626 m)   Wt 147 lb 6.4 oz (66.9 kg)   SpO2 98%   BMI 25.30 kg/m     Wt Readings from Last 3 Encounters:  04/30/18 147 lb 6.4 oz (66.9 kg)  03/09/18 141 lb (64 kg)  01/19/18 137 lb 14.4 oz (62.6 kg)     GEN:  Well nourished, well developed in no acute distress HEENT: Normal NECK: No JVD; No carotid bruits LYMPHATICS: No lymphadenopathy CARDIAC: RRR, no murmurs, no rubs, no gallops RESPIRATORY:  Clear to auscultation without rales, wheezing or rhonchi  ABDOMEN: Soft, non-tender, non-distended MUSCULOSKELETAL:  No edema; No deformity  SKIN: Warm and dry LOWER EXTREMITIES: no swelling NEUROLOGIC:  Alert and oriented x 3 PSYCHIATRIC:  Normal affect   ASSESSMENT:    1. Essential hypertension   2. Hypertrophic cardiomyopathy (HCC)   3. Abnormal EKG    PLAN:    In order of problems listed above:  1. Essential hypertension blood pressure appears to be well controlled however asked her to check her blood pressure on the regular basis to confirm that. 2. Hypertrophic cardiomyopathy.  Still doubtful diagnosis.  MRI was inconclusive recommendation was to follow-up with geneticist which we will do.  We talked in length about this condition she likely does not have any  markers that would indicate risk for sudden cardiac death.  We talked about the need to have genetic testing to identify the gene if she has any which will be helpful to then check her children. 3. Abnormal EKG again suspicion is that she does have hypertrophic cardiomyopathy.  She does not have any signs and symptoms of coronary artery disease however we started talking about doing a stress test she will wait until her uncle will fully recover after surgery and then will do exercise Cardiolite. 4. Dyslipidemia for now will do diet and exercise.   Medication Adjustments/Labs and Tests Ordered: Current medicines are reviewed at length with the patient today.  Concerns regarding medicines are outlined above.  No orders of the defined types were placed in this encounter.  Medication changes: No orders of the defined types were placed in this encounter.   Signed, Georgeanna Leaobert J. Roxie Kreeger, MD, [facility] 04/30/2018 11:10 AM    [facility]

## 2018-04-30 NOTE — Patient Instructions (Signed)
Medication Instructions:  Your physician recommends that you continue on your current medications as directed. Please refer to the Current Medication list given to you today.  If you need a refill on your cardiac medications before your next appointment, please call your pharmacy.   Lab work: None.   If you have labs (blood work) drawn today and your tests are completely normal, you will receive your results only by: Marland Kitchen. MyChart Message (if you have MyChart) OR . A paper copy in the mail If you have any lab test that is abnormal or we need to change your treatment, we will call you to review the results.  Testing/Procedures: None.   Follow-Up: At [facility], you and your health needs are our priority.  As part of our continuing mission to provide you with exceptional heart care, we have created designated Provider Care Teams.  These Care Teams include your primary Cardiologist (physician) and Advanced Practice Providers (APPs -  Physician Assistants and Nurse Practitioners) who all work together to provide you with the care you need, when you need it. You will need a follow up appointment in 4 months.  Please call our office 2 months in advance to schedule this appointment.  You may see No primary care provider on file. or another member of our [facility] Provider Team in [place]: Norman HerrlichBrian Munley, MD . Belva Cromeajan Revankar, MD  Any Other Special Instructions Will Be Listed Below (If Applicable).  Dr. Bing MatterKrasowski has advised you to see Dr. Sidney AceSumy Joseph. We will send referral. If you haven't heard from them within one week please call out office.

## 2018-05-17 ENCOUNTER — Ambulatory Visit: Payer: BC Managed Care – PPO | Admitting: Genetic Counselor

## 2018-05-18 ENCOUNTER — Telehealth: Payer: Self-pay

## 2018-05-18 ENCOUNTER — Other Ambulatory Visit: Payer: Self-pay

## 2018-05-18 DIAGNOSIS — I422 Other hypertrophic cardiomyopathy: Secondary | ICD-10-CM

## 2018-05-18 NOTE — Telephone Encounter (Signed)
Left voicemail at Dr. Nechama Guard office to see if they had enquired about [phone]. Waiting for further information.

## 2018-05-18 NOTE — Telephone Encounter (Signed)
Phone call has been returned. Explanation on [id number] was discussed with Dr. Bing Matter. An order will be placed. We will have lab tech verify that this lab can be drawn at this office or if patient will need to take the order to the [facility] draw station.

## 2018-05-25 NOTE — Progress Notes (Signed)
Pre Test [facility]  Referral Reason  Frances Bell was referred for genetic testing by Dr. 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   Genetic Consultation Notes  Frances Bell was counseled on the genetics of hypertrophic cardiomyopathy (HCM), namely its autosomal dominant inheritance. We also talked about incomplete penetrance, variable expression and digenic/compound mutations that can be seen in some patients with HCM. We briefly discussed the inheritance pattern and treatment plans for the infiltrative cardiomyopathies that present as HCM phenocopies.   We walked through the process of genetic testing. I explained to her that there are three possible outcomes of genetic testing; namely positive, negative and variant of unknown significance. A positive outcome can be expected in about 70%-75% of HCM cases that do not have risk factors for HCM, present early in life with increased severity and have a family history of sudden cardiac death and/or a relative that has been diagnosed with HCM. A negative test does not exclude a genetic basis for HCM. Limitations in current genetic testing methodology can produce a negative result. Variants of unknown significance (VUS) are typically observed in about 10%-15% of cases. However, this number can increase if genetic testing is performed by a laboratory that employs extremely large gene panels of 9 or more sarcomeric genes. I explained to her that typically a VUS is so classified if the variant is not well understood as very few individuals have been reported to harbor this variant or its role in gene function has not been elucidated. The potential outcomes of genetic testing and subsequent management of at-risk family members were discussed so as to manage expectations. His medical and 5-generation family history was obtained. See details below-  Personal Medical Information Frances Bell (III.5 on pedigree) is a very  pleasant 58 year-old Caucasian lady who is currently retired and spends most of her time taking care of her grandchildren. Frances Bell relates the events that led to her referral to a cardiologist. She states that on Sept 6, 2019, she fell and broke her tibia and fibula. The EMT detected an abnormal EKG that was also seen during her orthopedic surgery. She was referred to a cardiologist, Dr. Bing Matter who ordered an MRI that showed wall thickening suggestive of apical variant of hypertrophic cardiomyopathy.   Frances Bell has been asymptomatic and denies dyspnea, chest pains, or syncope and currently feels good.  Traditional Risk Factors Frances Bell states that she has hypertension for the past one year and that it is well controlled by medication. She does not have the other typical risk factors of HCM, namely aortic valve stenosis and is not involved in competitive sporting activities.  Family history Frances Bell (III.5) is the youngest of five siblings. She has two healthy children, a daughter age 23 (IV.9) and a son age 70 (IV.10).   There is no history of heart disease amongst her siblings (III.1-III.4) or their children (V.1-V.8) as well as in her paternal relatives. Her father (II.11) died of a brain aneurysm at age 28 as, did his mother (I.2) who died in her 70s. She is not close to her father's siblings (II.1-II.10) and is not familiar with their medical history. She reports two maternal uncles that have heart issues (II.16) and suffered a stroke (ii.17). Frances Bell's mother (II.12) died of lung cancer at age 2. There are no reports of sudden death or HCM in her family.  Impression  Frances Bell is asymptomatic and was recently found to have wall thickness suspicious of the apical variant of  hypertrophic cardiomyopathy. There is no significant family history of HCM or sudden death in her family. In light of her early age of presentation in the absence of risk factors, it is likely that she may have a de novo pathogenic variant for HCM.  Genetic testing is highly recommended to confirm her diagnosis and rule out the possibility of HCM phenocopy or hypertensive heart disease. The genetic test should include the major sarcomeric genes implicated in HCM as well as the HCM phenocopy genes.  In addition, we discussed the protections afforded by the Genetic Information Non-Discrimination Act (GINA). I explained to her that GINA protects her from losing her employment or health insurance based on her genotype. However, these protections do not cover life insurance and disability. She verbalized understanding of this.  Please note that the patient has not been counseled in this visit on personal, cultural or ethical issues that she may face due to her heart condition.   Plan Frances Bell is interested in genetic testing for HCM. Blood was drawn today and sent out for testing.    Frances Bell, Ph.D, [facility] Clinical Molecular Geneticist

## 2018-08-09 ENCOUNTER — Telehealth: Payer: Self-pay | Admitting: Cardiology

## 2018-08-09 NOTE — Telephone Encounter (Signed)
I called patient to talk about Virtual Visit and patient cancelled and I sent to [facility] and she states no need for follow up due to not having results of there labs that was sent for genetic testing and She has not heard ANYTHING yet and is curious what is going on with it, can someone please follow up with patient rgarding this issue?

## 2018-08-09 NOTE — Telephone Encounter (Signed)
Attempting to look into the this, I see a test date of 05/17/2018 but do not see any results. Would results be at an outside lab that we can't access from here?

## 2018-08-09 NOTE — Telephone Encounter (Signed)
Patient cancelled and will reschedule later date.

## 2018-08-20 ENCOUNTER — Telehealth: Payer: BC Managed Care – PPO | Admitting: Cardiology

## 2018-10-18 ENCOUNTER — Other Ambulatory Visit: Payer: Self-pay

## 2018-10-18 ENCOUNTER — Ambulatory Visit: Payer: BC Managed Care – PPO | Admitting: Genetic Counselor

## 2018-10-25 NOTE — Progress Notes (Signed)
Encounter Date: 10/18/2018  Due to the COVID-19 pandemic, patient consents to having a virtual genetic consult and for the visit details to be documented in her notes. Patient identity was confirmed using two unique identifiers of full name and date of birth   [place] Consultation notes  Frances Bell is here today with her daughter Mickel Baas for her post-test genetic consult. We first reviewed her pedigree and she informs that there have been no changes in her medical history or that of her family members.   I informed [name] that she does not have a pathogenic variant for HCM. I explained the reasons as to why her genetic test was negative. Clinical testing is restricted to the coding regions of the gene thus precluding the regulatory control elements that may impact gene function and hence protein activity.   She was relieved to hear this result. I explained to her that since she has a negative genetic result, it would be appropriate for her children to seek regular cardiology surveillance for HCM as this is an autosomal dominant disorder and they are at a 50% risk of inheriting HCM. She verbalized understanding of this.  Additionally, she qualifies for the Cardiomyopathy Study at [facility] as she is phenotype positive for HCM but genotype negative. I reiterated that she does have a genetic condition and with increasing numbers of patients enrolling in this study the knowledgebase will significantly improve our understanding allowing future generations to be tested with a higher positive outcome. She verbalized understanding of this.  Plan [name] is interested in enrolling in the Cardiomyopathy Research study at [facility]. We can get her enrolled in this study once the HCM clinic opens up for face-to-face visits.   Lattie Corns, Ph.D, [facility] Clinical Molecular Geneticist

## 2018-11-13 ENCOUNTER — Encounter: Payer: Self-pay | Admitting: Cardiology

## 2018-11-13 ENCOUNTER — Telehealth (INDEPENDENT_AMBULATORY_CARE_PROVIDER_SITE_OTHER): Payer: BC Managed Care – PPO | Admitting: Cardiology

## 2018-11-13 ENCOUNTER — Other Ambulatory Visit: Payer: Self-pay

## 2018-11-13 VITALS — BP 116/71 | Wt 154.0 lb

## 2018-11-13 DIAGNOSIS — I422 Other hypertrophic cardiomyopathy: Secondary | ICD-10-CM

## 2018-11-13 DIAGNOSIS — R9431 Abnormal electrocardiogram [ECG] [EKG]: Secondary | ICD-10-CM

## 2018-11-13 DIAGNOSIS — I10 Essential (primary) hypertension: Secondary | ICD-10-CM

## 2018-11-13 NOTE — Addendum Note (Signed)
Addended by: Particia Nearing B on: 11/13/2018 03:32 PM   Modules accepted: Orders

## 2018-11-13 NOTE — Progress Notes (Signed)
Virtual Visit via Video Note   This visit type was conducted due to national recommendations for restrictions regarding the COVID-19 Pandemic (e.g. social distancing) in an effort to limit this patient's exposure and mitigate transmission in our community.  Due to her co-morbid illnesses, this patient is at least at moderate risk for complications without adequate follow up.  This format is felt to be most appropriate for this patient at this time.  All issues noted in this document were discussed and addressed.  A limited physical exam was performed with this format.  Please refer to the patient's chart for her consent to telehealth for [facility].  Evaluation Performed:  Follow-up visit  This visit type was conducted due to national recommendations for restrictions regarding the COVID-19 Pandemic (e.g. social distancing).  This format is felt to be most appropriate for this patient at this time.  All issues noted in this document were discussed and addressed.  No physical exam was performed (except for noted visual exam findings with Video Visits).  Please refer to the patient's chart (MyChart message for video visits and phone note for telephone visits) for the patient's consent to telehealth for [facility].  Date:  11/13/2018  ID: Frances Bell, DOB [birth year], MRN [id number]   Patient Location: [place] [address]   Provider location:   [place] Office  PCP:  Cher Nakai, MD  Cardiologist:  Jenne Campus, MD     Chief Complaint: Doing well  History of Present Illness:    Frances Bell is a 58 y.o. female  who presents via audio/video conferencing for a telehealth visit today.  With hypertrophic cardiomyopathy.  It was incidental discovery first on EKG and then she was referred to [facility].  However genetic testing did not find any identifiable gene.  Overall she is doing well denies have any chest pain, tightness, squeezing, pressure, burning the chest,  denies having any palpitation dizziness or passing out.  We spent a great deal of time talking about this condition again I recommended her children to be checked at least with EKG.  She got grownup children so probably to be done every 5 years.  An abnormality of the EKG should trigger echocardiogram investigation.  I told her to let me know if she develop any worrisome symptoms like dizziness or passing out.   The patient does not have symptoms concerning for COVID-19 infection (fever, chills, cough, or new SHORTNESS OF BREATH).    Prior CV studies:   The following studies were reviewed today:       Past Medical History:  Diagnosis Date   Essential hypertension     Past Surgical History:  Procedure Laterality Date   VAGINAL HYSTERECTOMY       Current Meds  Medication Sig   alendronate (FOSAMAX) 70 MG tablet Take 1 tablet by mouth once a week.   amLODipine (NORVASC) 5 MG tablet Take 5 mg by mouth daily.    rOPINIRole (REQUIP) 0.5 MG tablet Take 1 mg by mouth at bedtime.      Family History: The patient's family history includes CAD in her mother; Lung cancer in her mother; Lymphoma in her sister; Prostate cancer in her brother.   ROS:   Please see the history of present illness.     All other systems reviewed and are negative.   Labs/Other Tests and Data Reviewed:     Recent Labs: 01/19/2018: ALT 16; BUN 11; Hemoglobin 12.2; Platelets 280; Potassium  3.7; Sodium 139 04/19/2018: Creatinine, Ser 0.75  Recent Lipid Panel    Component Value Date/Time   CHOL 140 01/19/2018 0041   TRIG 67 01/19/2018 0041   HDL 35 (L) 01/19/2018 0041   CHOLHDL 4.0 01/19/2018 0041   VLDL 13 01/19/2018 0041   LDLCALC 92 01/19/2018 0041      Exam:    Vital Signs:  BP 116/71    Wt 154 lb (69.9 kg)    BMI 26.43 kg/m     Wt Readings from Last 3 Encounters:  11/13/18 154 lb (69.9 kg)  04/30/18 147 lb 6.4 oz (66.9 kg)  03/09/18 141 lb (64 kg)     Well nourished, well  developed in no acute distress. Alert awake and x3.  We initially talk over the video link but quality was poor at night and then we eventually end up switching to phone conversation.  She is not in any distress doing well.  She exercise on a regular basis on a stationary bike.  Diagnosis for this visit:   1. Hypertrophic cardiomyopathy (HCC)   2. Essential hypertension   3. Abnormal EKG      ASSESSMENT & PLAN:    1.  Hypertrophic cardiomyopathy.  Asymptomatic.  No obstruction.  We will repeat echocardiogram in 2020 September. 2.  Essential hypertension blood pressure controlled continue present management. 3.  Abnormal EKG related to hypertrophic cardiomyopathy.  COVID-19 Education: The signs and symptoms of COVID-19 were discussed with the patient and how to seek care for testing (follow up with PCP or arrange E-visit).  The importance of social distancing was discussed today.  Patient Risk:   After full review of this patients clinical status, I feel that they are at least moderate risk at this time.  Time:   Today, I have spent 19 minutes with the patient with telehealth technology discussing pt health issues.  I spent 5 minutes reviewing her chart before the visit.  Visit was finished at 2:56 PM.    Medication Adjustments/Labs and Tests Ordered: Current medicines are reviewed at length with the patient today.  Concerns regarding medicines are outlined above.  No orders of the defined types were placed in this encounter.  Medication changes: No orders of the defined types were placed in this encounter.    Disposition: Follow-up in 6 months  Signed, Georgeanna Leaobert J. Maryum Batterson, MD, [facility] 11/13/2018 2:58 PM    [facility]

## 2018-11-13 NOTE — Patient Instructions (Addendum)
Medication Instructions:  Your physician recommends that you continue on your current medications as directed. Please refer to the Current Medication list given to you today.  If you need a refill on your cardiac medications before your next appointment, please call your pharmacy.   Lab work: NOne If you have labs (blood work) drawn today and your tests are completely normal, you will receive your results only by: Marland Kitchen MyChart Message (if you have MyChart) OR . A paper copy in the mail If you have any lab test that is abnormal or we need to change your treatment, we will call you to review the results.  Testing/Procedures: Your physician has requested that you have an echocardiogram. Echocardiography is a painless test that uses sound waves to create images of your heart. It provides your doctor with information about the size and shape of your heart and how well your heart's chambers and valves are working. This procedure takes approximately one hour. There are no restrictions for this procedure.  Your Appointment for your echo is 12/28/2018 11:15 AM  Follow-Up: At [facility], you and your health needs are our priority.  As part of our continuing mission to provide you with exceptional heart care, we have created designated Provider Care Teams.  These Care Teams include your primary Cardiologist (physician) and Advanced Practice Providers (APPs -  Physician Assistants and Nurse Practitioners) who all work together to provide you with the care you need, when you need it. You will need a follow up appointment in 6 months.   Any Other Special Instructions Will Be Listed Below (If Applicable).

## 2018-12-28 ENCOUNTER — Other Ambulatory Visit: Payer: Self-pay

## 2018-12-28 ENCOUNTER — Ambulatory Visit (INDEPENDENT_AMBULATORY_CARE_PROVIDER_SITE_OTHER): Payer: BC Managed Care – PPO

## 2018-12-28 DIAGNOSIS — I422 Other hypertrophic cardiomyopathy: Secondary | ICD-10-CM | POA: Diagnosis not present

## 2018-12-28 NOTE — Progress Notes (Signed)
Complete echocardiogram has been performed.  Jimmy Jeanne Diefendorf RDCS, RVT 

## 2019-01-01 ENCOUNTER — Telehealth: Payer: Self-pay | Admitting: Cardiology

## 2019-01-01 NOTE — Telephone Encounter (Signed)
Wants echo results from Friday

## 2019-01-03 ENCOUNTER — Telehealth: Payer: Self-pay | Admitting: Emergency Medicine

## 2019-01-03 NOTE — Telephone Encounter (Signed)
Left message for patient to return call regarding results  

## 2019-01-03 NOTE — Telephone Encounter (Signed)
Patient informed of results.  

## 2019-04-29 ENCOUNTER — Ambulatory Visit: Payer: BC Managed Care – PPO | Admitting: Cardiology

## 2019-04-30 ENCOUNTER — Ambulatory Visit: Payer: BC Managed Care – PPO | Admitting: Cardiology

## 2019-04-30 ENCOUNTER — Encounter: Payer: Self-pay | Admitting: Cardiology

## 2019-04-30 ENCOUNTER — Ambulatory Visit (INDEPENDENT_AMBULATORY_CARE_PROVIDER_SITE_OTHER): Payer: BC Managed Care – PPO | Admitting: Cardiology

## 2019-04-30 ENCOUNTER — Other Ambulatory Visit: Payer: Self-pay

## 2019-04-30 VITALS — BP 130/70 | HR 54 | Ht 64.0 in | Wt 152.4 lb

## 2019-04-30 DIAGNOSIS — R9431 Abnormal electrocardiogram [ECG] [EKG]: Secondary | ICD-10-CM | POA: Diagnosis not present

## 2019-04-30 DIAGNOSIS — I422 Other hypertrophic cardiomyopathy: Secondary | ICD-10-CM

## 2019-04-30 DIAGNOSIS — I10 Essential (primary) hypertension: Secondary | ICD-10-CM | POA: Diagnosis not present

## 2019-04-30 NOTE — Progress Notes (Signed)
Cardiology Office Note:    Date:  04/30/2019   ID:  Frances Bell, DOB [birth year], MRN [id number]  PCP:  Cher Nakai, MD  Cardiologist:  Jenne Campus, MD    Referring MD: Cher Nakai, MD   Chief Complaint  Patient presents with  . Follow-up  Doing very well  History of Present Illness:    Frances Bell is a 58 y.o. female with history of hypertrophic cardiomyopathy without obstruction.  Genetic testing negative.  Denies have any chest pain tightness squeezing pressure burning chest no palpitations no dizziness overall doing very well.  Exercise on a regular basis and I advised against strenuous exercise she does have mild to moderate activity.  Recently her daughter had EKG done and she was find to have significant LVH.  She is scheduled to see [place] for it.  Past Medical History:  Diagnosis Date  . Essential hypertension     Past Surgical History:  Procedure Laterality Date  . VAGINAL HYSTERECTOMY      Current Medications: Current Meds  Medication Sig  . alendronate (FOSAMAX) 70 MG tablet Take 1 tablet by mouth once a week.  Marland Kitchen amLODipine (NORVASC) 5 MG tablet Take 5 mg by mouth daily.   Marland Kitchen lisinopril (ZESTRIL) 20 MG tablet Take 20 mg by mouth daily.  Marland Kitchen rOPINIRole (REQUIP) 0.5 MG tablet Take 0.5 mg by mouth at bedtime.      Allergies:   Amoxicillin   Social History   Socioeconomic History  . Marital status: Unknown    Spouse name: Not on file  . Number of children: Not on file  . Years of education: Not on file  . Highest education level: Not on file  Occupational History  . Not on file  Tobacco Use  . Smoking status: Never Smoker  . Smokeless tobacco: Never Used  Substance and Sexual Activity  . Alcohol use: Not Currently  . Drug use: Never  . Sexual activity: Not on file  Other Topics Concern  . Not on file  Social History Narrative  . Not on file   Social Determinants of Health   Financial Resource Strain:   . Difficulty of Paying Living Expenses: Not on  file  Food Insecurity:   . Worried About Charity fundraiser in the Last Year: Not on file  . Ran Out of Food in the Last Year: Not on file  Transportation Needs:   . Lack of Transportation (Medical): Not on file  . Lack of Transportation (Non-Medical): Not on file  Physical Activity:   . Days of Exercise per Week: Not on file  . Minutes of Exercise per Session: Not on file  Stress:   . Feeling of Stress : Not on file  Social Connections:   . Frequency of Communication with Friends and Family: Not on file  . Frequency of Social Gatherings with Friends and Family: Not on file  . Attends Religious Services: Not on file  . Active Member of Clubs or Organizations: Not on file  . Attends Archivist Meetings: Not on file  . Marital Status: Not on file     Family History: The patient's family history includes CAD in her mother; Lung cancer in her mother; Lymphoma in her sister; Prostate cancer in her brother. ROS:   Please see the history of present illness.    All 14 point review of systems negative except as described per history of present illness  EKGs/Labs/Other Studies Reviewed:      Recent Labs:  No results found for requested labs within last 8760 hours.  Recent Lipid Panel    Component Value Date/Time   CHOL 140 01/19/2018 0041   TRIG 67 01/19/2018 0041   HDL 35 (L) 01/19/2018 0041   CHOLHDL 4.0 01/19/2018 0041   VLDL 13 01/19/2018 0041   LDLCALC 92 01/19/2018 0041    Physical Exam:    VS:  BP 130/70   Pulse (!) 54   Ht 5\' 4"  (1.626 m)   Wt 152 lb 6.4 oz (69.1 kg)   SpO2 98%   BMI 26.16 kg/m     Wt Readings from Last 3 Encounters:  04/30/19 152 lb 6.4 oz (69.1 kg)  11/13/18 154 lb (69.9 kg)  04/30/18 147 lb 6.4 oz (66.9 kg)     GEN:  Well nourished, well developed in no acute distress HEENT: Normal NECK: No JVD; No carotid bruits LYMPHATICS: No lymphadenopathy CARDIAC: RRR, no murmurs, no rubs, no gallops RESPIRATORY:  Clear to auscultation  without rales, wheezing or rhonchi  ABDOMEN: Soft, non-tender, non-distended MUSCULOSKELETAL:  No edema; No deformity  SKIN: Warm and dry LOWER EXTREMITIES: no swelling NEUROLOGIC:  Alert and oriented x 3 PSYCHIATRIC:  Normal affect   ASSESSMENT:    1. Hypertrophic cardiomyopathy (HCC)   2. Essential hypertension   3. Abnormal EKG    PLAN:    In order of problems listed above:  1. Hypertrophic cardiomyopathy asymptomatic.  No worrisome signs and symptoms that required any more intervention except for follow-up.  I will see her back in my office in about 6 months or sooner if she has a problem 2. Essential hypertension blood pressure well controlled continue present management. 3.    Medication Adjustments/Labs and Tests Ordered: Current medicines are reviewed at length with the patient today.  Concerns regarding medicines are outlined above.  No orders of the defined types were placed in this encounter.  Medication changes: No orders of the defined types were placed in this encounter.   Signed, 05/02/18, MD, [facility] 04/30/2019 11:38 AM    [facility]

## 2019-04-30 NOTE — Patient Instructions (Signed)

## 2019-10-31 IMAGING — MR MR CARD MORPHOLOGY WO/W CM
17 of 19 series · 38 of 40 positions shown · IV contrast (Gadavist)
Comparison: none

CLINICAL DATA: 57-year-old female with h/o abnormal ECG (LVH with
significant repolarization abnormalities in all leads).

EXAM:
CARDIAC MRI
TECHNIQUE: The patient was scanned on a 1.5 Tesla GE magnet. A dedicated
cardiac coil was used. Functional imaging was done using Fiesta
sequences. [DATE], and 4 chamber views were done to assess for RWMA's.
Modified Kinderreglih rule using a short axis stack was used to
calculate an ejection fraction on a dedicated work station using
Circle software. The patient received 10 cc of Gadavist. After 10
minutes inversion recovery sequences were used to assess for
infiltration and scar tissue.
CONTRAST:  10 cc  of Gadavist

[Series 6: bSSFP · oblique · 8.0mm · 1.61mm/px · 22 of 375 slices shown (1 of 4)]
[im 1/375]
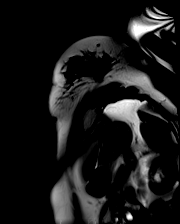
[im 18/375]
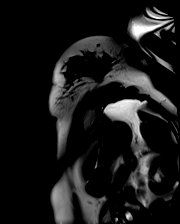
[im 36/375]
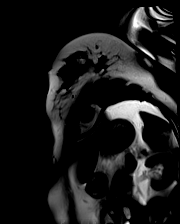
[im 54/375]
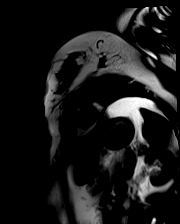
[im 72/375]
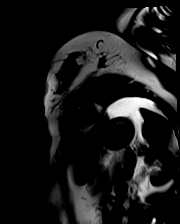
[im 90/375]
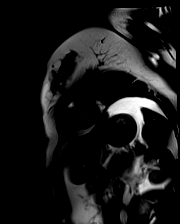
[im 107/375]
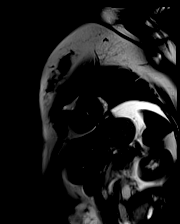
[im 125/375]
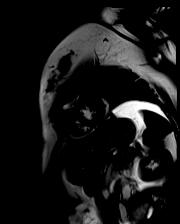
[im 143/375]
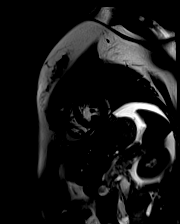
[im 161/375]
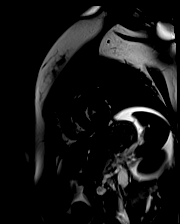
[im 179/375]
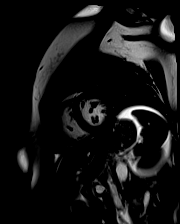
[im 196/375]
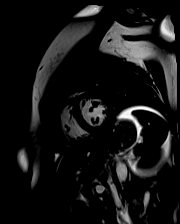
[im 214/375]
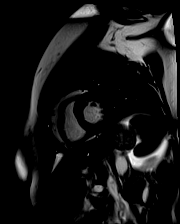
[im 232/375]
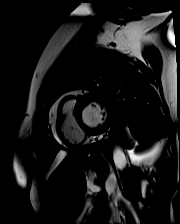
[im 250/375]
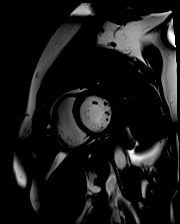
[im 268/375]
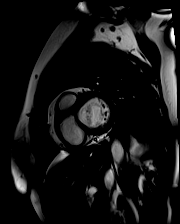
[im 285/375]
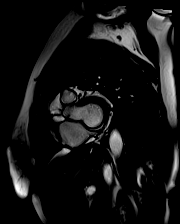
[im 303/375]
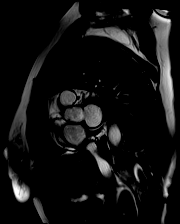
[im 321/375]
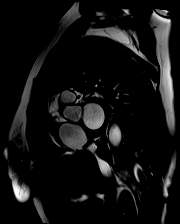
[im 339/375]
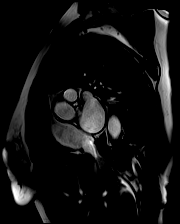
[im 357/375]
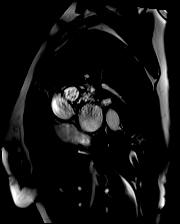
[im 375/375]
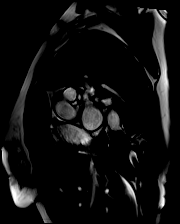

[Series 7: t2_stir_db_sax · oblique · 8.0mm · 1.73mm/px · 1 of 12 slices shown]
[im 1/12]
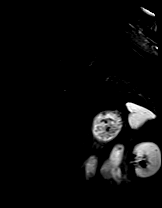

[Series 8: (id)_long_t1 · oblique · 8.0mm · 1.48mm/px · 1 of 24 slices shown (1 of 2)]
[im 1/24]
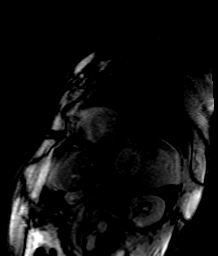

[Series 9: (id)_long_t1_moco · oblique · 8.0mm · 1.48mm/px · 1 of 24 slices shown (1 of 2)]
[im 1/24]
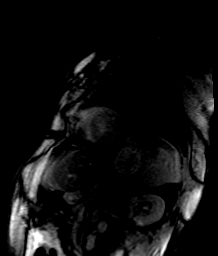

[Series 10: (id)_long_t1_moco_t1 · 8.0mm · 1.48mm/px · 1 of 3 slices shown (1 of 4)]
[im 1/3]
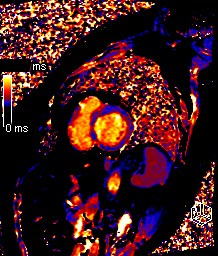

[Series 10: (id)_long_t1_moco_t1 · oblique · 8.0mm · 1.48mm/px · 1 of 3 slices shown (2 of 4)]
[im 1/3]
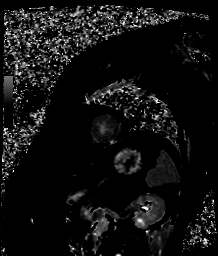

[Series 12: bSSFP · oblique · 7.0mm · 1.29mm/px · 1 of 25 slices shown (2 of 4)]
[im 1/25]
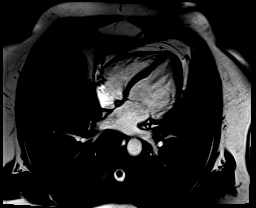

[Series 13: bSSFP · oblique · 7.0mm · 1.29mm/px · 1 of 25 slices shown (3 of 4)]
[im 1/25]
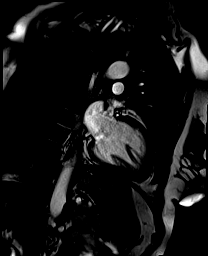

[Series 14: bSSFP · oblique · 7.0mm · 1.29mm/px · 1 of 25 slices shown (4 of 4)]
[im 1/25]
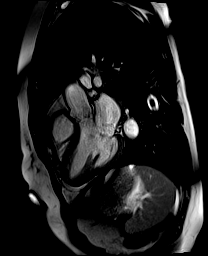

[Series 15: (id)_long_t1 · oblique · 8.0mm · 1.48mm/px · 1 of 24 slices shown (2 of 2)]
[im 1/24]
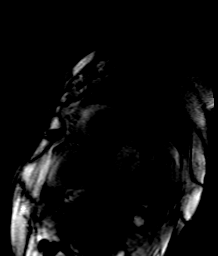

[Series 16: (id)_long_t1_moco · oblique · 8.0mm · 1.48mm/px · 1 of 24 slices shown (2 of 2)]
[im 1/24]
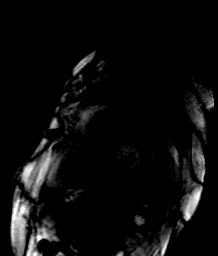

[Series 17: (id)_long_t1_moco_t1 · 8.0mm · 1.48mm/px · 1 of 3 slices shown (3 of 4)]
[im 1/3]
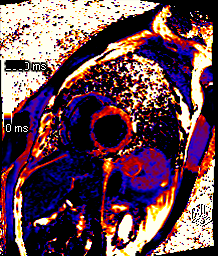

[Series 17: (id)_long_t1_moco_t1 · oblique · 8.0mm · 1.48mm/px · 1 of 3 slices shown (4 of 4)]
[im 1/3]
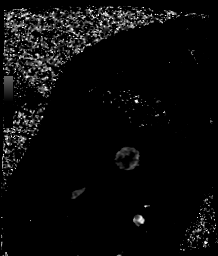

[Series 22: lge short axis_mag · oblique · 8.0mm · 1.50mm/px · 1 of 12 slices shown]
[im 1/12]
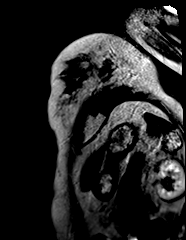

[Series 23: lge short axis_psir · oblique · 8.0mm · 1.50mm/px · 1 of 12 slices shown]
[im 1/12]
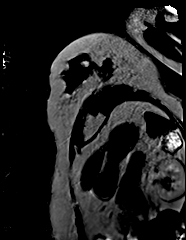

[Series 30: lge_single shot sa · oblique · 8.0mm · 1.98mm/px · 1 of 14 slices shown (1 of 2)]
[im 1/14]
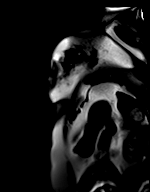

[Series 31: lge_single shot sa · oblique · 8.0mm · 1.98mm/px · 1 of 14 slices shown (2 of 2)]
[im 1/14]
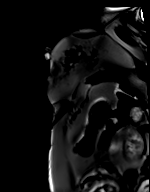

[38 of 40 positions shown; findings below may reference images not displayed]

FINDINGS: 1. Normal left ventricular size with mild concentric left
ventricular hypertrophy and normal systolic function (LVEF = 65%).
There are no regional wall motion abnormalities. There is a
spade-shape of the left ventricular apex in systole without
significant myocardial hypertrophy. There is no late gadolinium
enhancement in the left ventricular myocardium.

LVEDD: 53 mm

LVESD: 34 mm

LVEDV: 122 ml

LVESV: 43 ml

SV: 79 ml

CO: 5.9 L/min

Myocardial mass: 108 g

2. Normal right ventricular size, thickness and systolic function
(LVEF = 67%). There are no regional wall motion abnormalities.

3.  Mildly dilated left atrium (42 mm). Normal right atrial size.

4. Normal size of the aortic root, ascending aorta and pulmonary
artery.

5.  Mild mitral and tricuspid regurgitation.

6.  Normal pericardium.  No pericardial effusion.
IMPRESSION: 1. Normal left ventricular size with mild concentric left
ventricular hypertrophy and normal systolic function (LVEF = 65%).
There are no regional wall motion abnormalities. There is a
spade-shape of the left ventricular apex in systole without
significant myocardial hypertrophy. There is no late gadolinium
enhancement in the left ventricular myocardium.

2. Normal right ventricular size, thickness and systolic function
(LVEF = 67%). There are no regional wall motion abnormalities.

3.  Mildly dilated left atrium (42 mm). Normal right atrial size.

4. Normal size of the aortic root, ascending aorta and pulmonary
artery.

5.  Mild mitral and tricuspid regurgitation.

6.  Normal pericardium.  No pericardial effusion.

These findings are suspicious for an apical form of hypertrophic
cardiomyopathy but not consistent as there is no significant
myocardial hypertrophy, no apical blood trapping or late gadolinium
enhancement. Further evaluation with a geneticist (Dr Anjelica Troncoso,
PhD) is recommended.

## 2019-11-04 ENCOUNTER — Ambulatory Visit: Payer: BC Managed Care – PPO | Admitting: Cardiology

## 2019-11-04 ENCOUNTER — Other Ambulatory Visit: Payer: Self-pay

## 2019-11-04 ENCOUNTER — Encounter: Payer: Self-pay | Admitting: Cardiology

## 2019-11-04 VITALS — BP 138/76 | HR 75 | Ht 64.0 in | Wt 154.2 lb

## 2019-11-04 DIAGNOSIS — R9431 Abnormal electrocardiogram [ECG] [EKG]: Secondary | ICD-10-CM | POA: Diagnosis not present

## 2019-11-04 DIAGNOSIS — I10 Essential (primary) hypertension: Secondary | ICD-10-CM | POA: Diagnosis not present

## 2019-11-04 DIAGNOSIS — I422 Other hypertrophic cardiomyopathy: Secondary | ICD-10-CM

## 2019-11-04 NOTE — Progress Notes (Signed)
Cardiology Office Note:    Date:  11/04/2019   ID:  Frances Bell, DOB [birth year], MRN [id number]  PCP:  Simone Curia, MD  Cardiologist:  Gypsy Balsam, MD    Referring MD: Simone Curia, MD   No chief complaint on file. Doing very well  History of Present Illness:    Frances Bell is a 59 y.o. 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  Comes today 2 months for follow-up.  Overall doing well.  Denies have any chest pain, tightness, pressure, burning in the chest.  There is no dizziness no passing out.  She is trying to be active and exercise on a regular basis she uses stationary bike for her exercises and doing well.  She is disappointed because she is not losing weight.  Past Medical History:  Diagnosis Date  . Essential hypertension     Past Surgical History:  Procedure Laterality Date  . VAGINAL HYSTERECTOMY      Current Medications: Current Meds  Medication Sig  . alendronate (FOSAMAX) 70 MG tablet Take 1 tablet by mouth once a week.  Marland Kitchen amLODipine (NORVASC) 5 MG tablet Take 5 mg by mouth daily.   Marland Kitchen lisinopril (ZESTRIL) 20 MG tablet Take 20 mg by mouth daily.  Marland Kitchen rOPINIRole (REQUIP) 0.5 MG tablet Take 0.5 mg by mouth at bedtime.      Allergies:   Amoxicillin   Social History   Socioeconomic History  . Marital status: Unknown    Spouse name: Not on file  . Number of children: Not on file  . Years of education: Not on file  . Highest education level: Not on file  Occupational History  . Not on file  Tobacco Use  . Smoking status: Never Smoker  . Smokeless tobacco: Never Used  Vaping Use  . Vaping Use: Never used  Substance and Sexual Activity  . Alcohol use: Not Currently  . Drug use: Never  . Sexual activity: Not on file  Other Topics Concern  . Not on file  Social History Narrative  . Not on file   Social  Determinants of Health   Financial Resource Strain:   . Difficulty of Paying Living Expenses:   Food Insecurity:   . Worried About Programme researcher, broadcasting/film/video in the Last Year:   . Barista in the Last Year:   Transportation Needs:   . Freight forwarder (Medical):   Marland Kitchen Lack of Transportation (Non-Medical):   Physical Activity:   . Days of Exercise per Week:   . Minutes of Exercise per Session:   Stress:   . Feeling of Stress :   Social Connections:   . Frequency of Communication with Friends and Family:   . Frequency of Social Gatherings with Friends and Family:   . Attends Religious Services:   . Active Member of Clubs or Organizations:   . Attends Banker Meetings:   Marland Kitchen Marital Status:      Family History: The patient's family history includes CAD in her mother; Lung cancer in her mother; Lymphoma in her sister; Prostate cancer in her brother. ROS:   Please see the history of present illness.    All 14 point review of systems negative except as described per history of present illness  EKGs/Labs/Other Studies Reviewed:      Recent Labs: No results found for requested labs within last  8760 hours.  Recent Lipid Panel    Component Value Date/Time   CHOL 140 01/19/2018 0041   TRIG 67 01/19/2018 0041   HDL 35 (L) 01/19/2018 0041   CHOLHDL 4.0 01/19/2018 0041   VLDL 13 01/19/2018 0041   LDLCALC 92 01/19/2018 0041    Physical Exam:    VS:  BP 138/76 (BP Location: Left Arm, Patient Position: Sitting, Cuff Size: Normal)   Pulse 75   Ht 5\' 4"  (1.626 m)   Wt 154 lb 3.2 oz (69.9 kg)   SpO2 93%   BMI 26.47 kg/m     Wt Readings from Last 3 Encounters:  11/04/19 154 lb 3.2 oz (69.9 kg)  04/30/19 152 lb 6.4 oz (69.1 kg)  11/13/18 154 lb (69.9 kg)     GEN:  Well nourished, well developed in no acute distress HEENT: Normal NECK: No JVD; No carotid bruits LYMPHATICS: No lymphadenopathy CARDIAC: RRR, no murmurs, no rubs, no gallops RESPIRATORY:  Clear  to auscultation without rales, wheezing or rhonchi  ABDOMEN: Soft, non-tender, non-distended MUSCULOSKELETAL:  No edema; No deformity  SKIN: Warm and dry LOWER EXTREMITIES: no swelling NEUROLOGIC:  Alert and oriented x 3 PSYCHIATRIC:  Normal affect   ASSESSMENT:    1. Hypertrophic cardiomyopathy ([place])   2. Essential hypertension   3. Abnormal EKG    PLAN:    In order of problems listed above:  1. Hypertrophic cardiomyopathy without obstruction.  Physical exam did not reveal any heart murmur.  I will schedule her to have an echocardiogram to reassess hypertrophic cardiomyopathy.  In the future she may require to wearing the monitor.  Her family members has been screened for this condition and all of them are negative. 2. Essential hypertension blood pressure well controlled continue present management. 3. Dyslipidemia: I did review her K PN which showed LDL of 127 HDL 44, we did calculated her 10 years risk for atherosclerotic event which came 4.6.  Therefore, there is no indication for statin therapy at this stage.  We did talk about healthy lifestyle and exercises on a regular basis.   Medication Adjustments/Labs and Tests Ordered: Current medicines are reviewed at length with the patient today.  Concerns regarding medicines are outlined above.  No orders of the defined types were placed in this encounter.  Medication changes: No orders of the defined types were placed in this encounter.   Signed, Park Liter, MD, [facility] 11/04/2019 9:28 AM    [place]

## 2019-11-04 NOTE — Patient Instructions (Signed)
Medication Instructions:  °Your physician recommends that you continue on your current medications as directed. Please refer to the Current Medication list given to you today. ° °*If you need a refill on your cardiac medications before your next appointment, please call your pharmacy* ° ° °Lab Work: °None. ° °If you have labs (blood work) drawn today and your tests are completely normal, you will receive your results only by: °• MyChart Message (if you have MyChart) OR °• A paper copy in the mail °If you have any lab test that is abnormal or we need to change your treatment, we will call you to review the results. ° ° °Testing/Procedures: °Your physician has requested that you have an echocardiogram. Echocardiography is a painless test that uses sound waves to create images of your heart. It provides your doctor with information about the size and shape of your heart and how well your heart’s chambers and valves are working. This procedure takes approximately one hour. There are no restrictions for this procedure. ° ° ° ° °Follow-Up: °At [facility], you and your health needs are our priority.  As part of our continuing mission to provide you with exceptional heart care, we have created designated Provider Care Teams.  These Care Teams include your primary Cardiologist (physician) and Advanced Practice Providers (APPs -  Physician Assistants and Nurse Practitioners) who all work together to provide you with the care you need, when you need it. ° °We recommend signing up for the patient portal called "MyChart".  Sign up information is provided on this After Visit Summary.  MyChart is used to connect with patients for Virtual Visits (Telemedicine).  Patients are able to view lab/test results, encounter notes, upcoming appointments, etc.  Non-urgent messages can be sent to your provider as well.   °To learn more about what you can do with MyChart, go to [email or web address].   ° °Your next appointment:   °6  month(s) ° °The format for your next appointment:   °In Person ° °Provider:   °Robert Krasowski, MD ° ° °Other Instructions ° ° °Echocardiogram °An echocardiogram is a procedure that uses painless sound waves (ultrasound) to produce an image of the heart. Images from an echocardiogram can provide important information about: °· Signs of coronary artery disease (CAD). °· Aneurysm detection. An aneurysm is a weak or damaged part of an artery wall that bulges out from the normal force of blood pumping through the body. °· Heart size and shape. Changes in the size or shape of the heart can be associated with certain conditions, including heart failure, aneurysm, and CAD. °· Heart muscle function. °· Heart valve function. °· Signs of a past heart attack. °· Fluid buildup around the heart. °· Thickening of the heart muscle. °· A tumor or infectious growth around the heart valves. °Tell a health care provider about: °· Any allergies you have. °· All medicines you are taking, including vitamins, herbs, eye drops, creams, and over-the-counter medicines. °· Any blood disorders you have. °· Any surgeries you have had. °· Any medical conditions you have. °· Whether you are pregnant or may be pregnant. °What are the risks? °Generally, this is a safe procedure. However, problems may occur, including: °· Allergic reaction to dye (contrast) that may be used during the procedure. °What happens before the procedure? °No specific preparation is needed. You may eat and drink normally. °What happens during the procedure? ° °· An IV tube may be inserted into one of your veins. °· You may   receive contrast through this tube. A contrast is an injection that improves the quality of the pictures from your heart. °· A gel will be applied to your chest. °· A wand-like tool (transducer) will be moved over your chest. The gel will help to transmit the sound waves from the transducer. °· The sound waves will harmlessly bounce off of your heart to  allow the heart images to be captured in real-time motion. The images will be recorded on a computer. °The procedure may vary among health care providers and hospitals. °What happens after the procedure? °· You may return to your normal, everyday life, including diet, activities, and medicines, unless your health care provider tells you not to do that. °Summary °· An echocardiogram is a procedure that uses painless sound waves (ultrasound) to produce an image of the heart. °· Images from an echocardiogram can provide important information about the size and shape of your heart, heart muscle function, heart valve function, and fluid buildup around your heart. °· You do not need to do anything to prepare before this procedure. You may eat and drink normally. °· After the echocardiogram is completed, you may return to your normal, everyday life, unless your health care provider tells you not to do that. °This information is not intended to replace advice given to you by your health care provider. Make sure you discuss any questions you have with your health care provider. °Document Revised: 08/16/2018 Document Reviewed: 05/28/2016 °Elsevier Patient Education © 2020 [employer] ° ° °

## 2019-11-15 ENCOUNTER — Ambulatory Visit (INDEPENDENT_AMBULATORY_CARE_PROVIDER_SITE_OTHER): Payer: BC Managed Care – PPO

## 2019-11-15 ENCOUNTER — Other Ambulatory Visit: Payer: Self-pay

## 2019-11-15 DIAGNOSIS — R9431 Abnormal electrocardiogram [ECG] [EKG]: Secondary | ICD-10-CM

## 2019-11-15 DIAGNOSIS — I422 Other hypertrophic cardiomyopathy: Secondary | ICD-10-CM | POA: Diagnosis not present

## 2019-11-15 NOTE — Progress Notes (Signed)
Complete echocardiogram has been performed.  Jimmy Raneem Mendolia RDCS, RVT 

## 2019-11-25 ENCOUNTER — Telehealth: Payer: Self-pay

## 2019-11-25 DIAGNOSIS — I422 Other hypertrophic cardiomyopathy: Secondary | ICD-10-CM

## 2019-11-25 NOTE — Telephone Encounter (Signed)
Spoke with patient regarding results and recommendation.  Patient verbalizes understanding and is agreeable to plan of care. Advised patient to call back with any issues or concerns.   I will route this to scheduling to schedule the patient for this echo.

## 2019-11-25 NOTE — Telephone Encounter (Signed)
-----   Message from Georgeanna Lea, MD sent at 11/25/2019  8:36 AM EDT ----- Echocardiogram shows still presence of hypertrophic cardiomyopathy.  There is suspicion for apical form and variant of this.  Patient need to have a schedule another echocardiogram with [facility]

## 2019-12-16 ENCOUNTER — Other Ambulatory Visit: Payer: Self-pay

## 2019-12-16 ENCOUNTER — Ambulatory Visit (INDEPENDENT_AMBULATORY_CARE_PROVIDER_SITE_OTHER): Payer: BC Managed Care – PPO

## 2019-12-16 ENCOUNTER — Other Ambulatory Visit: Payer: Self-pay | Admitting: Cardiology

## 2019-12-16 DIAGNOSIS — I422 Other hypertrophic cardiomyopathy: Secondary | ICD-10-CM | POA: Diagnosis not present

## 2019-12-16 MED ORDER — PERFLUTREN LIPID MICROSPHERE: 1.0000 mL | INTRAVENOUS | 0 refills | Status: DC | PRN

## 2019-12-16 NOTE — Progress Notes (Signed)
Limited echocardiogram with contrast performed.  Jimmy Dionisio Aragones RDCS, RVT  

## 2019-12-20 ENCOUNTER — Telehealth: Payer: Self-pay | Admitting: Cardiology

## 2019-12-20 DIAGNOSIS — Z79899 Other long term (current) drug therapy: Secondary | ICD-10-CM

## 2019-12-20 DIAGNOSIS — I422 Other hypertrophic cardiomyopathy: Secondary | ICD-10-CM

## 2019-12-20 NOTE — Telephone Encounter (Signed)
Monitor registered

## 2019-12-20 NOTE — Telephone Encounter (Signed)
Pt verbalized understanding of her Echo results and agreed to wearing a 7 day Zio Patch per Dr. Vanetta Shawl recommendations. Will place the order in Epic.   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 Please ask her to wear Zio patch for 1 week we will looking for any ventricular arrhythmias to stratify)

## 2019-12-20 NOTE — Telephone Encounter (Signed)
Patient is returning call to discuss results from echocardiogram completed on 12/16/19.

## 2019-12-24 ENCOUNTER — Ambulatory Visit (INDEPENDENT_AMBULATORY_CARE_PROVIDER_SITE_OTHER): Payer: BC Managed Care – PPO

## 2019-12-24 DIAGNOSIS — I422 Other hypertrophic cardiomyopathy: Secondary | ICD-10-CM

## 2019-12-24 DIAGNOSIS — Z79899 Other long term (current) drug therapy: Secondary | ICD-10-CM | POA: Diagnosis not present

## 2020-04-22 DIAGNOSIS — I10 Essential (primary) hypertension: Secondary | ICD-10-CM | POA: Insufficient documentation

## 2020-04-24 ENCOUNTER — Other Ambulatory Visit: Payer: Self-pay

## 2020-04-24 ENCOUNTER — Ambulatory Visit: Payer: BC Managed Care – PPO | Admitting: Cardiology

## 2020-04-24 ENCOUNTER — Encounter: Payer: Self-pay | Admitting: Cardiology

## 2020-04-24 VITALS — BP 106/68 | HR 63 | Ht 63.0 in | Wt 146.0 lb

## 2020-04-24 DIAGNOSIS — E785 Hyperlipidemia, unspecified: Secondary | ICD-10-CM | POA: Insufficient documentation

## 2020-04-24 DIAGNOSIS — I10 Essential (primary) hypertension: Secondary | ICD-10-CM

## 2020-04-24 DIAGNOSIS — I422 Other hypertrophic cardiomyopathy: Secondary | ICD-10-CM | POA: Diagnosis not present

## 2020-04-24 DIAGNOSIS — R9431 Abnormal electrocardiogram [ECG] [EKG]: Secondary | ICD-10-CM | POA: Diagnosis not present

## 2020-04-24 HISTORY — DX: Hyperlipidemia, unspecified: E78.5

## 2020-04-24 NOTE — Patient Instructions (Signed)

## 2020-04-24 NOTE — Progress Notes (Signed)
Cardiology Office Note:    Date:  04/24/2020   ID:  Frances Bell, DOB [birth year], MRN [id number]  PCP:  Simone Curia, MD  Cardiologist:  Gypsy Balsam, MD    Referring MD: Simone Curia, MD   Chief Complaint  Patient presents with  . Follow-up  Doing fine  History of Present Illness:    Frances Bell is a 59 y.o. 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 Mild hypertension, dyslipidemia. Comes today 2 months of follow-up. Overall doing great. Asymptomatic, no chest pain tightness squeezing pressure burning chest. She exercise on the regular basis on the stationary bike has no difficulty doing it.  Past Medical History:  Diagnosis Date  . Abnormal EKG 01/18/2018  . Closed right fibular fracture 01/18/2018  . Essential hypertension   . Hypertension 01/18/2018  . Hypertrophic cardiomyopathy (HCC) 04/30/2018    Past Surgical History:  Procedure Laterality Date  . VAGINAL HYSTERECTOMY      Current Medications: Current Meds  Medication Sig  . alendronate (FOSAMAX) 70 MG tablet Take 1 tablet by mouth once a week.  Marland Kitchen amLODipine (NORVASC) 5 MG tablet Take 5 mg by mouth daily.   Marland Kitchen lisinopril (ZESTRIL) 20 MG tablet Take 20 mg by mouth daily.  Marland Kitchen rOPINIRole (REQUIP) 0.5 MG tablet Take 0.5 mg by mouth at bedtime.      Allergies:   Amoxicillin   Social History   Socioeconomic History  . Marital status: Unknown    Spouse name: Not on file  . Number of children: Not on file  . Years of education: Not on file  . Highest education level: Not on file  Occupational History  . Not on file  Tobacco Use  . Smoking status: Never Smoker  . Smokeless tobacco: Never Used  Vaping Use  . Vaping Use: Never used  Substance and Sexual Activity  . Alcohol use: Not Currently  . Drug use: Never  . Sexual activity: Not on file  Other Topics Concern  . Not on file  Social History Narrative  . Not on file   Social Determinants of Health   Financial  Resource Strain: Not on file  Food Insecurity: Not on file  Transportation Needs: Not on file  Physical Activity: Not on file  Stress: Not on file  Social Connections: Not on file     Family History: The patient's family history includes CAD in her mother; Lung cancer in her mother; Lymphoma in her sister; Prostate cancer in her brother. ROS:   Please see the history of present illness.    All 14 point review of systems negative except as described per history of present illness  EKGs/Labs/Other Studies Reviewed:      Recent Labs: No results found for requested labs within last 8760 hours.  Recent Lipid Panel    Component Value Date/Time   CHOL 140 01/19/2018 0041   TRIG 67 01/19/2018 0041   HDL 35 (L) 01/19/2018 0041   CHOLHDL 4.0 01/19/2018 0041   VLDL 13 01/19/2018 0041   LDLCALC 92 01/19/2018 0041    Physical Exam:    VS:  BP 106/68 (BP Location: Right Arm, Patient Position: Sitting)   Pulse 63   Ht 5\' 3"  (1.6 m)   Wt 146 lb (66.2 kg)   SpO2 96%   BMI 25.86 kg/m     Wt Readings from Last 3 Encounters:  04/24/20 146 lb (66.2 kg)  11/04/19 154 lb 3.2 oz (69.9 kg)  04/30/19 152 lb 6.4 oz (  69.1 kg)     GEN:  Well nourished, well developed in no acute distress HEENT: Normal NECK: No JVD; No carotid bruits LYMPHATICS: No lymphadenopathy CARDIAC: RRR, no murmurs, no rubs, no gallops RESPIRATORY:  Clear to auscultation without rales, wheezing or rhonchi  ABDOMEN: Soft, non-tender, non-distended MUSCULOSKELETAL:  No edema; No deformity  SKIN: Warm and dry LOWER EXTREMITIES: no swelling NEUROLOGIC:  Alert and oriented x 3 PSYCHIATRIC:  Normal affect   ASSESSMENT:    1. Hypertrophic cardiomyopathy (HCC)   2. Primary hypertension   3. Essential hypertension   4. Abnormal EKG   5. Dyslipidemia    PLAN:    In order of problems listed above:  1. Hypertrophic cardiomyopathy doing well from that point review. No significant arrhythmia on the monitor, no  dizziness no syncope no family history of sudden cardiac death, genetically negative. Continue present management. In the summer she required another echocardiogram. 2. Essential hypertension blood pressure perfectly controlled continue monitoring. 3. Dyslipidemia I did review her K PN which show me LDL of 100 HDL 43. Last time we calculated her risk for 10 years which is less than 5% which is low, we did talk again today about diet at length I recommended Mediterranean diet, she already exercise on the regular basis which is excellent advised to continue.   Medication Adjustments/Labs and Tests Ordered: Current medicines are reviewed at length with the patient today.  Concerns regarding medicines are outlined above.  No orders of the defined types were placed in this encounter.  Medication changes: No orders of the defined types were placed in this encounter.   Signed, Georgeanna Lea, MD, [facility] 04/24/2020 10:02 AM    [facility]

## 2020-10-12 ENCOUNTER — Ambulatory Visit: Payer: BC Managed Care – PPO | Admitting: Cardiology

## 2020-11-12 ENCOUNTER — Encounter: Payer: Self-pay | Admitting: Cardiology

## 2020-11-12 ENCOUNTER — Ambulatory Visit (INDEPENDENT_AMBULATORY_CARE_PROVIDER_SITE_OTHER): Payer: BC Managed Care – PPO | Admitting: Cardiology

## 2020-11-12 ENCOUNTER — Other Ambulatory Visit: Payer: Self-pay

## 2020-11-12 VITALS — BP 130/66 | HR 69 | Ht 64.0 in | Wt 150.2 lb

## 2020-11-12 DIAGNOSIS — I422 Other hypertrophic cardiomyopathy: Secondary | ICD-10-CM

## 2020-11-12 DIAGNOSIS — R9431 Abnormal electrocardiogram [ECG] [EKG]: Secondary | ICD-10-CM | POA: Diagnosis not present

## 2020-11-12 DIAGNOSIS — I10 Essential (primary) hypertension: Secondary | ICD-10-CM | POA: Diagnosis not present

## 2020-11-12 DIAGNOSIS — E785 Hyperlipidemia, unspecified: Secondary | ICD-10-CM

## 2020-11-12 NOTE — Progress Notes (Signed)
Echo

## 2020-11-12 NOTE — Progress Notes (Signed)
Cardiology Office Note:    Date:  11/12/2020   ID:  Frances Bell, DOB 23-Apr-1961, MRN [id number]  PCP:  Simone Curia, MD  Cardiologist:  Gypsy Balsam, MD    Referring MD: Simone Curia, MD   Chief Complaint  Patient presents with   Follow-up  Doing very well  History of Present Illness:    Frances Bell is a 60 y.o. 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 She is coming today to my office for follow-up.  Overall she is doing great she likes to ride stationary bike and she right half an hour in the morning half an hour in the afternoon there is no dizziness no passing out no shortness of breath no palpitations overall she is doing great.  Past Medical History:  Diagnosis Date   Abnormal EKG 01/18/2018   Closed right fibular fracture 01/18/2018   Dyslipidemia 04/24/2020   Essential hypertension    Hypertension 01/18/2018   Hypertrophic cardiomyopathy (HCC) 04/30/2018    Past Surgical History:  Procedure Laterality Date   VAGINAL HYSTERECTOMY      Current Medications: Current Meds  Medication Sig   alendronate (FOSAMAX) 70 MG tablet Take 1 tablet by mouth once a week.   amLODipine (NORVASC) 5 MG tablet Take 5 mg by mouth daily.    lisinopril (ZESTRIL) 20 MG tablet Take 20 mg by mouth daily.   rOPINIRole (REQUIP) 0.5 MG tablet Take 0.5 mg by mouth at bedtime.      Allergies:   Amoxicillin   Social History   Socioeconomic History   Marital status: Unknown    Spouse name: Not on file   Number of children: Not on file   Years of education: Not on file   Highest education level: Not on file  Occupational History   Not on file  Tobacco Use   Smoking status: Never   Smokeless tobacco: Never  Vaping Use   Vaping Use: Never used  Substance and Sexual Activity   Alcohol use: Not Currently   Drug use: Never   Sexual activity:  Not on file  Other Topics Concern   Not on file  Social History Narrative   Not on file   Social Determinants of Health   Financial Resource Strain: Not on file  Food Insecurity: Not on file  Transportation Needs: Not on file  Physical Activity: Not on file  Stress: Not on file  Social Connections: Not on file     Family History: The patient's family history includes CAD in her mother; Lung cancer in her mother; Lymphoma in her sister; Prostate cancer in her brother. ROS:   Please see the history of present illness.    All 14 point review of systems negative except as described per history of present illness  EKGs/Labs/Other Studies Reviewed:      Recent Labs: No results found for requested labs within last 8760 hours.  Recent Lipid Panel    Component Value Date/Time   CHOL 140 01/19/2018 0041   TRIG 67 01/19/2018 0041   HDL 35 (L) 01/19/2018 0041   CHOLHDL 4.0 01/19/2018 0041   VLDL 13 01/19/2018 0041   LDLCALC 92 01/19/2018 0041    Physical Exam:    VS:  BP 130/66 (BP Location: Right Arm, Patient Position: Sitting)   Pulse 69   Ht 5\' 4"  (1.626 m)   Wt 150 lb 3.2 oz (68.1 kg)   SpO2  97%   BMI 25.78 kg/m     Wt Readings from Last 3 Encounters:  11/12/20 150 lb 3.2 oz (68.1 kg)  04/24/20 146 lb (66.2 kg)  11/04/19 154 lb 3.2 oz (69.9 kg)     GEN:  Well nourished, well developed in no acute distress HEENT: Normal NECK: No JVD; No carotid bruits LYMPHATICS: No lymphadenopathy CARDIAC: RRR, no murmurs, no rubs, no gallops RESPIRATORY:  Clear to auscultation without rales, wheezing or rhonchi  ABDOMEN: Soft, non-tender, non-distended MUSCULOSKELETAL:  No edema; No deformity  SKIN: Warm and dry LOWER EXTREMITIES: no swelling NEUROLOGIC:  Alert and oriented x 3 PSYCHIATRIC:  Normal affect   ASSESSMENT:    1. Hypertrophic cardiomyopathy (HCC)   2. Primary hypertension   3. Abnormal EKG   4. Dyslipidemia    PLAN:    In order of problems listed  above:  Hypertrophic cardiomyopathy apical variant.  No arrhythmia detected on the monitor, does not have any palpitations no dizziness no shortness of breath no chest pain.  Doing well.  We will continue present management. Essential hypertension well-controlled with blood pressure 130/66 today. Abnormal EKG related to hypertrophic cardiomyopathy.  Her children has been checked also for hypertrophic cardiomyopathy we did find abnormal EKG and her daughter.  Again genetic testing has been negative. Dyslipidemia I did review her K PN from 25 June with show me LDL of 101 HDL 46.  I did calculated her 10 years predicted risk which is 3.4% which is low.  No need to initiate any medical therapy however we did talk about need to exercise which she is already does as well as good diet.   Medication Adjustments/Labs and Tests Ordered: Current medicines are reviewed at length with the patient today.  Concerns regarding medicines are outlined above.  No orders of the defined types were placed in this encounter.  Medication changes: No orders of the defined types were placed in this encounter.   Signed, Georgeanna Lea, MD, [facility] 11/12/2020 4:26 PM    [facility]

## 2020-11-12 NOTE — Patient Instructions (Signed)
Medication Instructions:  Your physician recommends that you continue on your current medications as directed. Please refer to the Current Medication list given to you today.  *If you need a refill on your cardiac medications before your next appointment, please call your pharmacy*   Lab Work:  If you have labs (blood work) drawn today and your tests are completely normal, you will receive your results only by: MyChart Message (if you have MyChart) OR A paper copy in the mail If you have any lab test that is abnormal or we need to change your treatment, we will call you to review the results.   Testing/Procedures: Your physician has requested that you have an echocardiogram. Echocardiography is a painless test that uses sound waves to create images of your heart. It provides your doctor with information about the size and shape of your heart and how well your heart's chambers and valves are working. This procedure takes approximately one hour. There are no restrictions for this procedure.    Follow-Up: At [facility], you and your health needs are our priority.  As part of our continuing mission to provide you with exceptional heart care, we have created designated Provider Care Teams.  These Care Teams include your primary Cardiologist (physician) and Advanced Practice Providers (APPs -  Physician Assistants and Nurse Practitioners) who all work together to provide you with the care you need, when you need it.  We recommend signing up for the patient portal called "MyChart".  Sign up information is provided on this After Visit Summary.  MyChart is used to connect with patients for Virtual Visits (Telemedicine).  Patients are able to view lab/test results, encounter notes, upcoming appointments, etc.  Non-urgent messages can be sent to your provider as well.   To learn more about what you can do with MyChart, go to [email or web address].    Your next appointment:   1 year(s)  The  format for your next appointment:   In Person  Provider:   Gypsy Balsam, MD   Other Instructions

## 2020-12-07 ENCOUNTER — Ambulatory Visit (INDEPENDENT_AMBULATORY_CARE_PROVIDER_SITE_OTHER): Payer: BC Managed Care – PPO

## 2020-12-07 ENCOUNTER — Other Ambulatory Visit: Payer: Self-pay

## 2020-12-07 DIAGNOSIS — E785 Hyperlipidemia, unspecified: Secondary | ICD-10-CM

## 2020-12-07 DIAGNOSIS — I10 Essential (primary) hypertension: Secondary | ICD-10-CM

## 2020-12-07 DIAGNOSIS — R9431 Abnormal electrocardiogram [ECG] [EKG]: Secondary | ICD-10-CM

## 2020-12-07 DIAGNOSIS — I422 Other hypertrophic cardiomyopathy: Secondary | ICD-10-CM | POA: Diagnosis not present

## 2020-12-07 LAB — ECHOCARDIOGRAM COMPLETE
Area-P 1/2: 4.6 cm2
S' Lateral: 3.5 cm

## 2020-12-07 NOTE — Progress Notes (Signed)
Complete echocardiogram performed.  Jimmy Sheena Simonis RDCS, RVT  

## 2020-12-08 ENCOUNTER — Other Ambulatory Visit: Payer: BC Managed Care – PPO

## 2022-08-31 ENCOUNTER — Ambulatory Visit: Payer: BC Managed Care – PPO | Attending: Cardiology | Admitting: Cardiology

## 2022-08-31 ENCOUNTER — Encounter: Payer: Self-pay | Admitting: Cardiology

## 2022-08-31 VITALS — BP 128/74 | HR 63 | Ht 64.0 in | Wt 155.8 lb

## 2022-08-31 DIAGNOSIS — E785 Hyperlipidemia, unspecified: Secondary | ICD-10-CM

## 2022-08-31 DIAGNOSIS — I10 Essential (primary) hypertension: Secondary | ICD-10-CM

## 2022-08-31 DIAGNOSIS — R9431 Abnormal electrocardiogram [ECG] [EKG]: Secondary | ICD-10-CM

## 2022-08-31 DIAGNOSIS — R0609 Other forms of dyspnea: Secondary | ICD-10-CM

## 2022-08-31 DIAGNOSIS — I422 Other hypertrophic cardiomyopathy: Secondary | ICD-10-CM | POA: Diagnosis not present

## 2022-08-31 NOTE — Progress Notes (Signed)
Cardiology Office Note:    Date:  08/31/2022   ID:  Frances Bell, DOB May 22, 1960, MRN [id number]  PCP:  Simone Curia, MD  Cardiologist:  Gypsy Balsam, MD    Referring MD: Simone Curia, MD   Chief Complaint  Patient presents with   Follow-up  Doing fine  History of Present Illness:    Frances Bell is a 62 y.o. 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 Comes today to my office for follow-up.  Overall doing very well.  Asymptomatic, no chest pain tightness squeezing pressure burning chest no palpitations or dizziness swelling of lower extremities.  Past Medical History:  Diagnosis Date   Abnormal EKG 01/18/2018   Closed right fibular fracture 01/18/2018   Dyslipidemia 04/24/2020   Essential hypertension    Hypertension 01/18/2018   Hypertrophic cardiomyopathy 04/30/2018    Past Surgical History:  Procedure Laterality Date   VAGINAL HYSTERECTOMY      Current Medications: Current Meds  Medication Sig   alendronate (FOSAMAX) 70 MG tablet Take 1 tablet by mouth once a week.   amLODipine (NORVASC) 5 MG tablet Take 5 mg by mouth daily.    lisinopril (ZESTRIL) 20 MG tablet Take 20 mg by mouth daily.   rOPINIRole (REQUIP) 0.5 MG tablet Take 0.5 mg by mouth at bedtime.      Allergies:   Amoxicillin   Social History   Socioeconomic History   Marital status: Married    Spouse name: Not on file   Number of children: Not on file   Years of education: Not on file   Highest education level: Not on file  Occupational History   Not on file  Tobacco Use   Smoking status: Never   Smokeless tobacco: Never  Vaping Use   Vaping Use: Never used  Substance and Sexual Activity   Alcohol use: Not Currently   Drug use: Never   Sexual activity: Not on file  Other Topics Concern   Not on file  Social History Narrative   Not on file   Social  Determinants of Health   Financial Resource Strain: Not on file  Food Insecurity: Not on file  Transportation Needs: Not on file  Physical Activity: Not on file  Stress: Not on file  Social Connections: Not on file     Family History: The patient's family history includes CAD in her mother; Lung cancer in her mother; Lymphoma in her sister; Prostate cancer in her brother. ROS:   Please see the history of present illness.    All 14 point review of systems negative except as described per history of present illness  EKGs/Labs/Other Studies Reviewed:      Recent Labs: No results found for requested labs within last 365 days.  Recent Lipid Panel    Component Value Date/Time   CHOL 140 01/19/2018 0041   TRIG 67 01/19/2018 0041   HDL 35 (L) 01/19/2018 0041   CHOLHDL 4.0 01/19/2018 0041   VLDL 13 01/19/2018 0041   LDLCALC 92 01/19/2018 0041    Physical Exam:    VS:  BP 128/74 (BP Location: Left Arm, Patient Position: Sitting)   Pulse 63   Ht  (1.626 m)   Wt 155 lb 12.8 oz (70.7 kg)   SpO2 97%   BMI 26.74 kg/m     Wt Readings from Last 3 Encounters:  08/31/22 155 lb 12.8 oz (70.7 kg)  11/12/20 150 lb  3.2 oz (68.1 kg)  04/24/20 146 lb (66.2 kg)     GEN:  Well nourished, well developed in no acute distress HEENT: Normal NECK: No JVD; No carotid bruits LYMPHATICS: No lymphadenopathy CARDIAC: RRR, no murmurs, no rubs, no gallops RESPIRATORY:  Clear to auscultation without rales, wheezing or rhonchi  ABDOMEN: Soft, non-tender, non-distended MUSCULOSKELETAL:  No edema; No deformity  SKIN: Warm and dry LOWER EXTREMITIES: no swelling NEUROLOGIC:  Alert and oriented x 3 PSYCHIATRIC:  Normal affect   ASSESSMENT:    1. Hypertrophic cardiomyopathy   2. Abnormal EKG   3. Dyslipidemia   4. Essential hypertension    PLAN:    In order of problems listed above:  Hypertrophic obstructive cardiomyopathy apical variant.  Completely asymptomatic genetic testing  negative.  She will have echocardiogram done to reassess degree of hypertrophy make sure she does not develop apical aneurysm.  She does not have any symptoms that would indicate arrhythmia.  No palpitations no dizziness.  We had again long discussion about the fact that the children need to be checked and need to be checked on a regular basis since we have no identification of the gene. Abnormal EKG related to hypertrophic cardiomyopathy. Dyslipidemia I did review K PN which show me LDL of 90 HDL 44.  Will continue present management. Essential hypertension blood pressure well-controlled.   Medication Adjustments/Labs and Tests Ordered: Current medicines are reviewed at length with the patient today.  Concerns regarding medicines are outlined above.  No orders of the defined types were placed in this encounter.  Medication changes: No orders of the defined types were placed in this encounter.   Signed, Georgeanna Lea, MD, [facility] 08/31/2022 2:58 PM    [facility]

## 2022-08-31 NOTE — Patient Instructions (Addendum)

## 2022-10-24 ENCOUNTER — Ambulatory Visit: Payer: BC Managed Care – PPO | Attending: Cardiology

## 2022-10-24 DIAGNOSIS — R0609 Other forms of dyspnea: Secondary | ICD-10-CM

## 2022-10-24 LAB — ECHOCARDIOGRAM COMPLETE: S' Lateral: 2.7 cm

## 2022-10-24 MED ORDER — PERFLUTREN LIPID MICROSPHERE
1.0000 mL | INTRAVENOUS | Status: AC | PRN
Start: 2022-10-24 — End: 2022-10-24
  Administered 2022-10-24: 3 mL via INTRAVENOUS

## 2023-11-26 NOTE — Progress Notes (Unsigned)
 Cardiology Office Note   Date:  11/28/2023  ID:  Frances Bell, DOB 1960-12-31, MRN [id number] PCP: Jama Chow, MD  [facility] Providers Cardiologist:  Lamar Fitch, MD Cardiology APP:  Carlin Delon BROCKS, NP     History of Present Illness Frances Bell is a 63 y.o. female with a past medical history of hypertrophic cardiomyopathy, hypertension, dyslipidemia.  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 There is no late gadolinium enhancement in the left ventricular myocardium.  She established care with Dr. Fitch in 2019 after she had sustained a fall, necessitating surgery, her EKG was found to be abnormal.  She had a cardiac MRI at this time revealing a spade shape of the left ventricular apex but no late GLE.  Echo in 2021 revealed apical variant hypertrophy, monitor was arranged at this time to look for ventricular arrhythmias which was largely normal.  Most recently evaluated by Dr. Fitch on 08/31/2022, genetic testing previously negative for hypertrophic cardiomyopathy, she was well from a cardiac perspective, repeat echocardiogram was arranged to evaluate for an apical aneurysm, plans to follow-up in 1 year.  She presents today for follow-up of her hypertrophic cardiomyopathy.  She has been doing well, she has no formal complaints.  She does state that both of her children were  tested and her daughter has the gene as well.  She stays very physically active, rides her stationary bike at least twice a day, is always able to close the rings on her Apple Watch.  She has noticed that her blood pressure has been a little bit more elevated than it previously has been in the past and this is causing some concern from her understandably. She denies chest pain, palpitations, dyspnea, pnd, orthopnea, n, v, dizziness, syncope, edema, weight gain, or early satiety.    ROS: Review of Systems  All other systems reviewed and are negative.    Studies Reviewed EKG Interpretation Date/Time:  Tuesday November 28 2023 15:25:39 EDT Ventricular Rate:  59 PR Interval:  124 QRS Duration:  86 QT Interval:  430 QTC Calculation: 425 R Axis:   26  Text Interpretation: Sinus bradycardia Left ventricular hypertrophy with repolarization abnormality ( R in aVL , Cornell product , Romhilt-Estes ) Posterior infarct , age undetermined When compared with ECG of 19-Jan-2018 06:57, ST more depressed Lateral leads T wave inversion less evident in Anterior leads Confirmed by Carlin Delon [phone]) on 11/28/2023 3:31:04 PM    Cardiac Studies & Procedures   ______________________________________________________________________________________________     ECHOCARDIOGRAM  ECHOCARDIOGRAM COMPLETE 10/24/2022  Narrative ECHOCARDIOGRAM REPORT    [name]  Date of Exam: 10/24/2022 Medical Rec #:  [id number]  Height:       64.0 in Accession #:    [id number] Weight:       155.0 lb Date of Birth:  09-08-1960  BSA:          1.755 m Patient Age:  62 years   BP:           128/74 mmHg Patient Gender: F          HR:           77 bpm. Exam Location:  [place]  Procedure: 2D Echo, Cardiac Doppler, Color Doppler and Strain Analysis  Indications:    Dyspnea on exertion [R06.09 (ICD-10-CM)]  History:        Patient has prior history of Echocardiogram examinations, most recent 12/07/2020.  Hypertrophic cardiomyopathy, Arrythmias:Abnormal EKG; Risk Factors:Hypertension and Dyslipidemia.  Sonographer:    Lynwood Silvas RDCS Referring Phys: Lamar Fitch MD   Sonographer Comments: Global longitudinal strain was attempted. IMPRESSIONS   1. Consider apical hypertrophic cardiomyopathy. Left ventricular ejection fraction, by estimation, is 60 to 65%. The left ventricle has normal function. The left ventricle has no regional wall motion abnormalities. There is moderate concentric left ventricular hypertrophy of the apical segment. Left ventricular diastolic parameters are consistent with Grade II diastolic dysfunction (pseudonormalization). 2. Right ventricular systolic function is normal. The right ventricular size is normal. There is normal pulmonary artery systolic pressure. 3. The mitral valve is normal in structure. No evidence of mitral valve regurgitation. No evidence of mitral stenosis. 4. The aortic valve is normal in structure. Aortic valve regurgitation is not visualized. No aortic stenosis is present. 5. The inferior vena cava is normal in size with greater than 50% respiratory variability, suggesting right atrial pressure of 3 mmHg.  FINDINGS Left Ventricle: Consider apical hypertrophic cardiomyopathy. Left ventricular ejection fraction, by estimation, is 60 to 65%. The left ventricle has normal function. The left ventricle has no regional wall motion abnormalities. Definity  contrast agent was given IV to delineate the left ventricular endocardial borders. The left ventricular internal cavity size was normal in size. There is moderate concentric left ventricular hypertrophy of the apical segment. Left ventricular diastolic parameters are consistent with Grade II diastolic dysfunction (pseudonormalization).  Right Ventricle: The right ventricular size is normal. No increase in right ventricular wall thickness. Right ventricular systolic function is normal. There is normal  pulmonary artery systolic pressure. The tricuspid regurgitant velocity is 2.17 m/s, and with an assumed right atrial pressure of 3 mmHg, the estimated right ventricular systolic pressure is 21.8 mmHg.  Left Atrium: Left atrial size was normal in size.  Right Atrium: Right atrial size was normal in size.  Pericardium: There is no evidence of pericardial effusion.  Mitral Valve: The mitral valve is normal in structure. No evidence of mitral valve regurgitation. No evidence of mitral valve stenosis.  Tricuspid Valve: The tricuspid valve is normal in structure. Tricuspid valve regurgitation is trivial. No evidence of tricuspid stenosis.  Aortic Valve: The aortic valve is normal in structure. Aortic valve regurgitation is not visualized. No aortic stenosis is present.  Pulmonic Valve: The pulmonic valve was normal in structure. Pulmonic valve regurgitation is not visualized. No evidence of pulmonic stenosis.  Aorta: The aortic root is normal in size and structure.  Venous: The inferior vena cava is normal in size with greater than 50% respiratory variability, suggesting right atrial pressure of 3 mmHg.  IAS/Shunts: No atrial level shunt detected by color flow Doppler.   LEFT VENTRICLE PLAX 2D LVIDd:         5.10 cm   Diastology LVIDs:         2.70 cm   LV e' medial:    5.60 cm/s LV PW:         1.10 cm  LV E/e' medial:  13.2 LV IVS:        1.10 cm   LV e' lateral:   7.70 cm/s LVOT diam:     2.00 cm   LV E/e' lateral: 9.6 LV SV:         62 LV SV Index:   35 LVOT Area:     3.14 cm   RIGHT VENTRICLE RV Length:      3.00 cm TAPSE (M-mode): 12.0 cm  LEFT ATRIUM           Index LA diam:      4.00 cm 2.28 cm/m LA Vol (A4C): 40.9 ml 23.30 ml/m AORTIC VALVE LVOT Vmax:   82.00 cm/s LVOT Vmean:  56.200 cm/s LVOT VTI:    0.196 m  AORTA Ao Asc diam:  3.20 cm Ao Desc diam: 2.44 cm  MV E velocity: 74.00 cm/s    TRICUSPID VALVE MV A velocity: 6600.00 cm/s  TR Peak grad:   18.8  mmHg MV E/A ratio:  0.01          TR Vmax:        217.00 cm/s  SHUNTS Systemic VTI:  0.20 m Systemic Diam: 2.00 cm  Lamar Fitch MD Electronically signed by Lamar Fitch MD Signature Date/Time: 10/24/2022/12:57:48 PM    Final    MONITORS  LONG TERM MONITOR (3-14 DAYS) 01/14/2020  Narrative Therisa Hoit, DOB [birth year], MRN [id number]  HOLTER MONITOR REPORT:    Date of test:                 12/24/2019 Duration of test:           6 days Indication:                    Palpitations Ordering physician:  Lamar JINNY Fitch, MD Referring physician:  Lamar JINNY Fitch, MD   Baseline rhythm: Sinus  Minimum heart rate: 46 BPM.  Average heart rate: 78 BPM.  Maximal heart rate 132 BPM.  Atrial arrhythmia: Infrequent APCs  Ventricular arrhythmia: None  Conduction abnormality: None  Symptoms: None   Conclusion: Benign Holter monitor  Interpreting  cardiologist: Lamar Fitch, MD Date: 01/20/2020 9:15 PM     CARDIAC MRI  MR CARDIAC MORPHOLOGY W WO CONTRAST 04/19/2018  Narrative CLINICAL DATA:  63 year old female with h/o abnormal ECG (LVH with significant repolarization abnormalities in all leads).  EXAM: CARDIAC MRI  TECHNIQUE: The patient was scanned on a 1.5 Tesla GE magnet. A dedicated cardiac coil was used. Functional imaging was done using Fiesta sequences. 2,3, and 4 chamber views were done to assess for RWMA's. Modified Simpson's rule using a short axis stack was used to calculate an ejection fraction on a dedicated work [employer]. The patient received 10 cc of Gadavist . After 10 minutes inversion recovery sequences were used to assess for infiltration and scar tissue.  CONTRAST:  10 cc  of Gadavist   FINDINGS: 1. Normal left ventricular size with mild concentric left ventricular hypertrophy and normal systolic function (LVEF = 65%). There are no regional wall motion abnormalities. There is a spade-shape of the left  ventricular apex in systole without significant myocardial hypertrophy. There is no late gadolinium enhancement in the left ventricular myocardium.  LVEDD: 53 mm  LVESD: 34 mm  LVEDV: 122 ml  LVESV: 43 ml  SV: 79 ml  CO: 5.9 L/min  Myocardial mass: 108 g  2. Normal right ventricular size, thickness and systolic function (LVEF = 67%). There  are no regional wall motion abnormalities.  3.  Mildly dilated left atrium (42 mm). Normal right atrial size.  4. Normal size of the aortic root, ascending aorta and pulmonary artery.  5.  Mild mitral and tricuspid regurgitation.  6.  Normal pericardium.  No pericardial effusion.  IMPRESSION: 1. Normal left ventricular size with mild concentric left ventricular hypertrophy and normal systolic function (LVEF = 65%). There are no regional wall motion abnormalities. There is a spade-shape of the left ventricular apex in systole without significant myocardial hypertrophy. There is no late gadolinium enhancement in the left ventricular myocardium.  2. Normal right ventricular size, thickness and systolic function (LVEF = 67%). There are no regional wall motion abnormalities.  3.  Mildly dilated left atrium (42 mm). Normal right atrial size.  4. Normal size of the aortic root, ascending aorta and pulmonary artery.  5.  Mild mitral and tricuspid regurgitation.  6.  Normal pericardium.  No pericardial effusion.  These findings are suspicious for an apical form of hypertrophic cardiomyopathy but not consistent as there is no significant myocardial hypertrophy, no apical blood trapping or late gadolinium enhancement. Further evaluation with a geneticist (Dr Danford Pac, PhD) is recommended.   Electronically Signed By: Leim Moose On: 04/20/2018 18:11   ______________________________________________________________________________________________      Risk Assessment/Calculations           Physical Exam VS:  BP 136/70    Pulse (!) 59   Ht 5' 4 (1.626 m)   Wt 156 lb (70.8 kg)   SpO2 98%   BMI 26.78 kg/m        Wt Readings from Last 3 Encounters:  11/28/23 156 lb (70.8 kg)  08/31/22 155 lb 12.8 oz (70.7 kg)  11/12/20 150 lb 3.2 oz (68.1 kg)    GEN: Well nourished, well developed in no acute distress NECK: No JVD; No carotid bruits CARDIAC: RRR, no murmurs, rubs, gallops RESPIRATORY:  Clear to auscultation without rales, wheezing or rhonchi  ABDOMEN: Soft, non-tender, non-distended EXTREMITIES:  No edema; No deformity   ASSESSMENT AND PLAN Hypertrophic cardiomyopathy-she is asymptomatic, will repeat echocardiogram.  Hypertension -blood pressure slightly elevated today 136/70, she is currently taking Norvasc 5 mg in the morning and valsartan 80 mg in the evening.  Will have her keep a blood pressure log for 2 weeks before we make any changes, she cannot tolerate higher doses of amlodipine secondary to pedal edema so we would likely increase her dose of valsartan.       Dispo: Echocardiogram, follow-up in 1 year.  Signed, Delon JAYSON Hoover, NP

## 2023-11-28 ENCOUNTER — Encounter: Payer: Self-pay | Admitting: Cardiology

## 2023-11-28 ENCOUNTER — Ambulatory Visit: Payer: Self-pay | Attending: Cardiology | Admitting: Cardiology

## 2023-11-28 VITALS — BP 136/70 | HR 59 | Ht 64.0 in | Wt 156.0 lb

## 2023-11-28 DIAGNOSIS — I10 Essential (primary) hypertension: Secondary | ICD-10-CM | POA: Diagnosis not present

## 2023-11-28 DIAGNOSIS — I422 Other hypertrophic cardiomyopathy: Secondary | ICD-10-CM

## 2023-11-28 DIAGNOSIS — E785 Hyperlipidemia, unspecified: Secondary | ICD-10-CM | POA: Diagnosis not present

## 2023-11-28 NOTE — Patient Instructions (Signed)
 Medication Instructions:  Your physician recommends that you continue on your current medications as directed. Please refer to the Current Medication list given to you today.  *If you need a refill on your cardiac medications before your next appointment, please call your pharmacy*  Lab Work: None If you have labs (blood work) drawn today and your tests are completely normal, you will receive your results only by: MyChart Message (if you have MyChart) OR A paper copy in the mail If you have any lab test that is abnormal or we need to change your treatment, we will call you to review the results.  Testing/Procedures: Your physician has requested that you have an echocardiogram. Echocardiography is a painless test that uses sound waves to create images of your heart. It provides your doctor with information about the size and shape of your heart and how well your heart's chambers and valves are working. This procedure takes approximately one hour. There are no restrictions for this procedure. Please do NOT wear cologne, perfume, aftershave, or lotions (deodorant is allowed). Please arrive 15 minutes prior to your appointment time.  Please note: We ask at that you not bring children with you during ultrasound (echo/ vascular) testing. Due to room size and safety concerns, children are not allowed in the ultrasound rooms during exams. Our front office staff cannot provide observation of children in our lobby area while testing is being conducted. An adult accompanying a patient to their appointment will only be allowed in the ultrasound room at the discretion of the ultrasound technician under special circumstances. We apologize for any inconvenience.   Follow-Up: At [facility], you and your health needs are our priority.  As part of our continuing mission to provide you with exceptional heart care, our providers are all part of one team.  This team includes your primary Cardiologist  (physician) and Advanced Practice Providers or APPs (Physician Assistants and Nurse Practitioners) who all work together to provide you with the care you need, when you need it.  Your next appointment:   1 year(s)  Provider:   Lamar Fitch, MD    We recommend signing up for the patient portal called MyChart.  Sign up information is provided on this After Visit Summary.  MyChart is used to connect with patients for Virtual Visits (Telemedicine).  Patients are able to view lab/test results, encounter notes, upcoming appointments, etc.  Non-urgent messages can be sent to your provider as well.   To learn more about what you can do with MyChart, go to [email or web address].   Other Instructions Please keep a BP log for 2 weeks and send by MyChart or mail.                          Name and DOB__________________________ Frances Hoover, NP [address] [place], [place] [address]  Blood Pressure Record Sheet To take your blood pressure, you will need a blood pressure machine. You can buy a blood pressure machine (blood pressure monitor) at your clinic, drug store, or online. When choosing one, consider: An automatic monitor that has an arm cuff. A cuff that wraps snugly around your upper arm. You should be able to fit only one finger between your arm and the cuff. A device that stores blood pressure reading results. Do not choose a monitor that measures your blood pressure from your wrist or finger. Follow your health care provider's instructions for how to take your blood pressure. To  use this form: Get one reading in the morning (a.m.) 1-2 hours after you take any medicines. Get one reading in the evening (p.m.) before supper.   Blood pressure log Date: _______________________  a.m. _____________________(1st reading) HR___________            p.m. _____________________(2nd reading) HR__________  Date: _______________________  a.m. _____________________(1st reading)  HR___________            p.m. _____________________(2nd reading) HR__________  Date: _______________________  a.m. _____________________(1st reading) HR___________            p.m. _____________________(2nd reading) HR__________  Date: _______________________  a.m. _____________________(1st reading) HR___________            p.m. _____________________(2nd reading) HR__________  Date: _______________________  a.m. _____________________(1st reading) HR___________            p.m. _____________________(2nd reading) HR__________  Date: _______________________  a.m. _____________________(1st reading) HR___________            p.m. _____________________(2nd reading) HR__________  Date: _______________________  a.m. _____________________(1st reading) HR___________            p.m. _____________________(2nd reading) HR__________   This information is not intended to replace advice given to you by your health care provider. Make sure you discuss any questions you have with your health care provider. Document Revised: 08/14/2019 Document Reviewed: 08/14/2019 Elsevier Patient Education  2021 [employer].

## 2023-12-15 ENCOUNTER — Ambulatory Visit: Attending: Cardiology

## 2023-12-15 DIAGNOSIS — I422 Other hypertrophic cardiomyopathy: Secondary | ICD-10-CM | POA: Diagnosis not present

## 2023-12-15 DIAGNOSIS — I10 Essential (primary) hypertension: Secondary | ICD-10-CM | POA: Diagnosis not present

## 2023-12-15 DIAGNOSIS — E785 Hyperlipidemia, unspecified: Secondary | ICD-10-CM | POA: Diagnosis not present

## 2023-12-15 LAB — ECHOCARDIOGRAM COMPLETE
Area-P 1/2: 3.42 cm2
S' Lateral: 3 cm

## 2023-12-15 MED ORDER — PERFLUTREN LIPID MICROSPHERE
1.0000 mL | INTRAVENOUS | Status: AC | PRN
  Administered 2023-12-15: 10 mL via INTRAVENOUS

## 2023-12-19 ENCOUNTER — Telehealth: Payer: Self-pay

## 2023-12-19 ENCOUNTER — Ambulatory Visit: Payer: Self-pay | Admitting: Cardiology

## 2023-12-19 NOTE — Telephone Encounter (Signed)
 Patient returned RN's call and noted she has noted the results received in MyChart.

## 2023-12-19 NOTE — Telephone Encounter (Signed)
 Left message on My Chart with Echo results per [facility] note. Routed to PCP.

## 2024-03-28 ENCOUNTER — Telehealth: Payer: Self-pay | Admitting: Cardiology

## 2024-03-28 NOTE — Telephone Encounter (Signed)
 Pt c/o medication issue:  1. Name of Medication: Celebrex 200 mg twice a day   2. How are you currently taking this medication (dosage and times per day)? As written   3. Are you having a reaction (difficulty breathing--STAT)? no  4. What is your medication issue? Pt PCP prescribed medication pt would like to make sure this medication is ok to take please advise

## 2024-04-12 NOTE — Telephone Encounter (Signed)
 Called the patient and informed her of Dr. Karry recommendation below regarding her taking Celebrex:  Should be fine to take Celebrex  Patient verbalized understanding and had no further questions at this time.

## 2024-04-12 NOTE — Telephone Encounter (Signed)
 Left message for the patient to call back.

## 2024-04-12 NOTE — Telephone Encounter (Signed)
 Patient returned RN's call regarding medication issue.
